# Patient Record
Sex: Female | Born: 2000 | Race: White | Hispanic: No | Marital: Single | Smoking: Never smoker
Health system: Southern US, Community
[De-identification: ages and names within clinical notes are randomized; demographics above are authoritative.]

## PROBLEM LIST (undated history)

## (undated) DIAGNOSIS — F419 Anxiety disorder, unspecified: Secondary | ICD-10-CM

## (undated) DIAGNOSIS — G43909 Migraine, unspecified, not intractable, without status migrainosus: Secondary | ICD-10-CM

## (undated) DIAGNOSIS — F32A Depression, unspecified: Secondary | ICD-10-CM

## (undated) DIAGNOSIS — F329 Major depressive disorder, single episode, unspecified: Secondary | ICD-10-CM

---
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
## 2021-05-26 IMAGING — CT CT RENAL STONE PROTOCOL
2 of 4 series · 16 of 46 positions shown, 18 images · non-contrast
Comparison: One view abdomen 05/22/2020. Pelvic ultrasound
05/23/2020.

CLINICAL DATA: Flank pain and hematuria. Suspected ureteral
calculus.

EXAM:
CT ABDOMEN AND PELVIS WITHOUT CONTRAST
TECHNIQUE: Multidetector CT imaging of the abdomen and pelvis was performed
following the standard protocol without IV contrast.

[Series 3: axial st · axial · 0.62mm/px · z∈[-476,-71]mm · 13 of 89 slices shown, 15 images]
[im 4/89  soft-tissue]
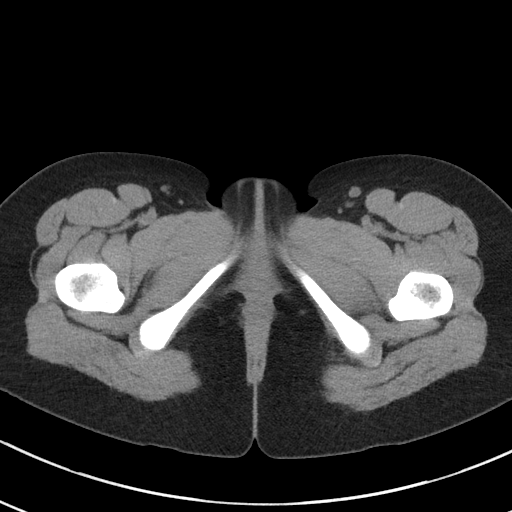
[im 4/89  bone]
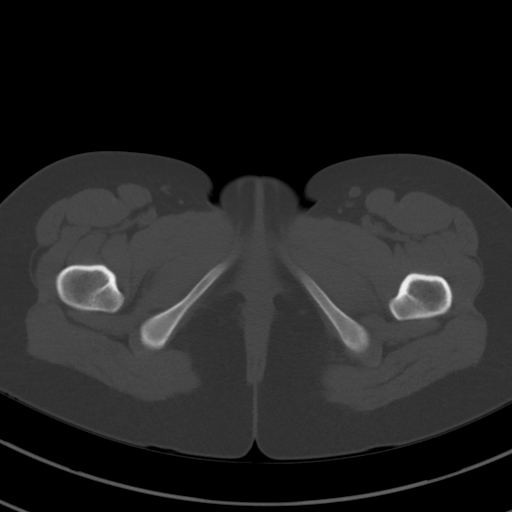
[im 11/89  soft-tissue]
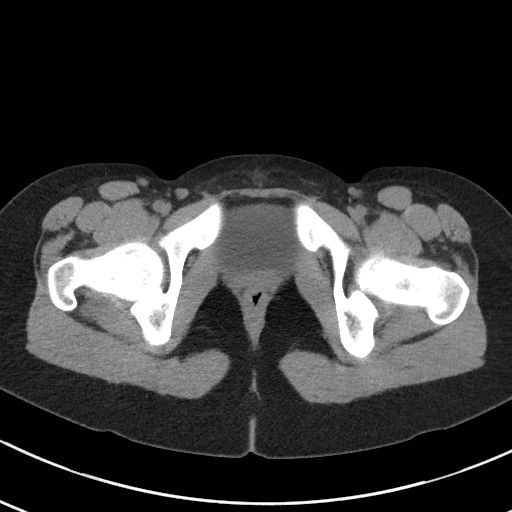
[im 17/89  soft-tissue]
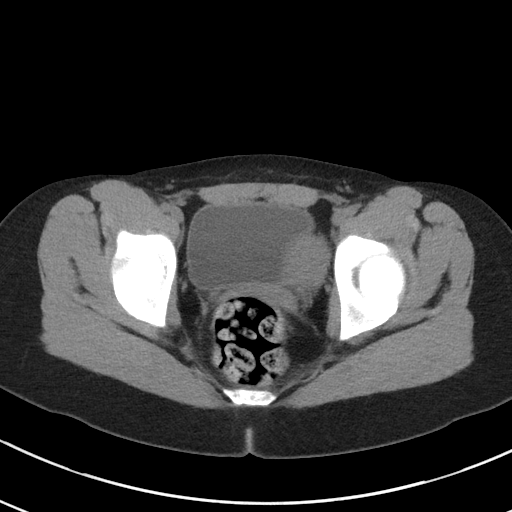
[im 24/89  soft-tissue]
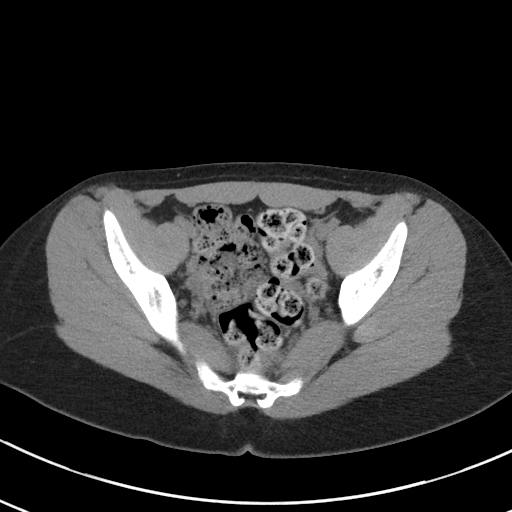
[im 31/89  soft-tissue]
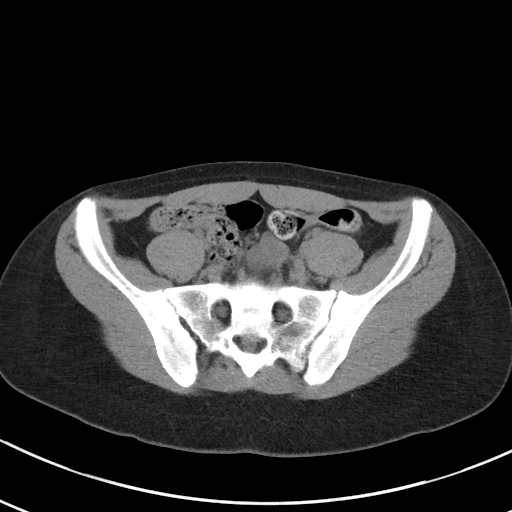
[im 38/89  soft-tissue]
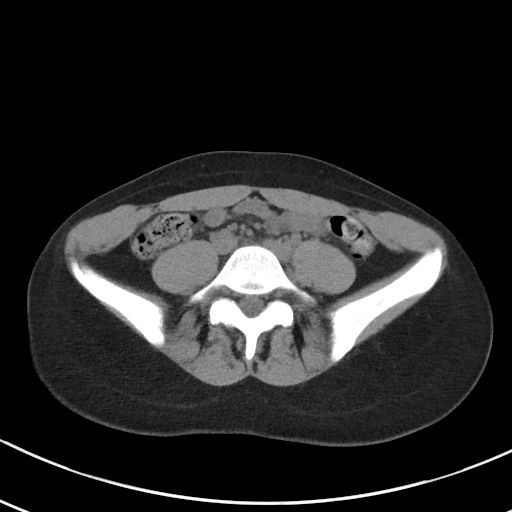
[im 45/89  soft-tissue]
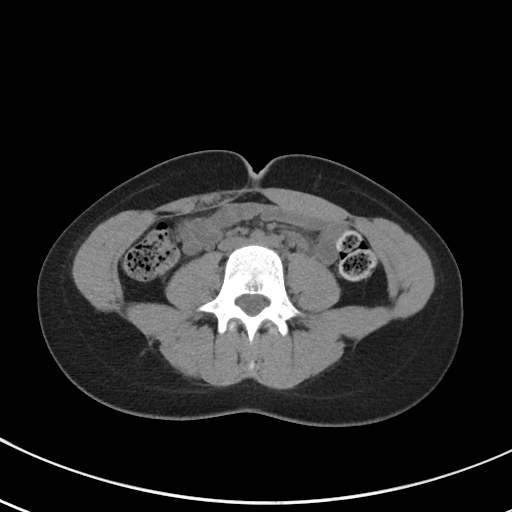
[im 51/89  soft-tissue]
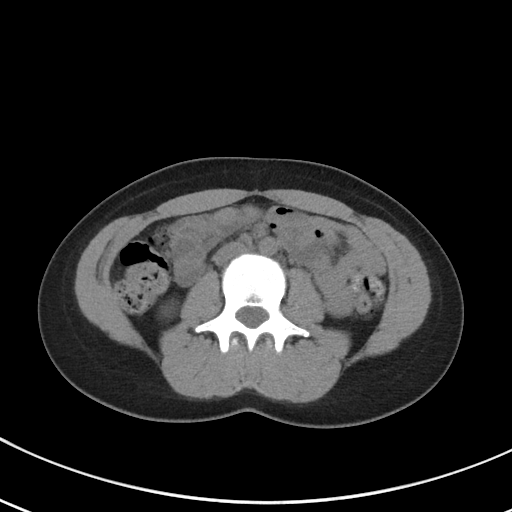
[im 58/89  soft-tissue]
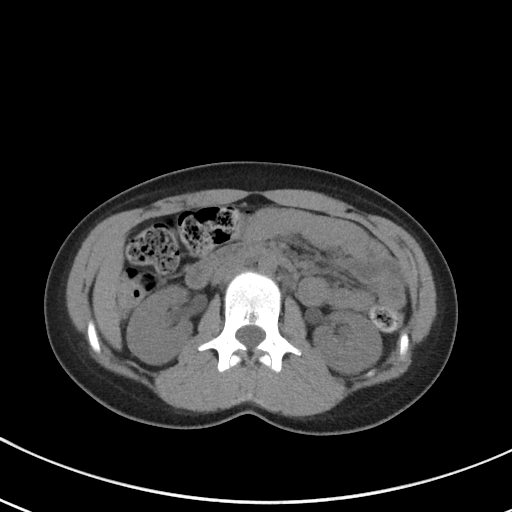
[im 58/89  bone]
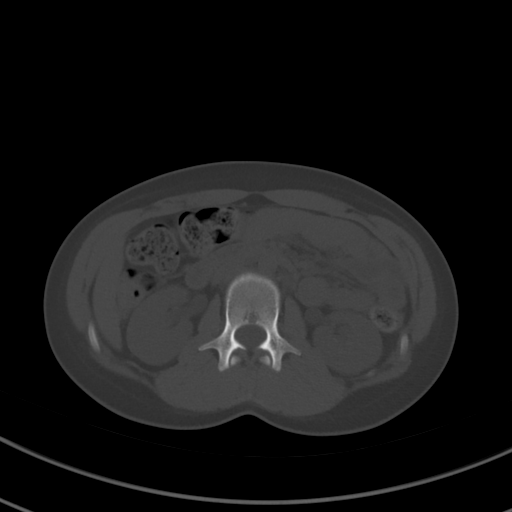
[im 65/89  soft-tissue]
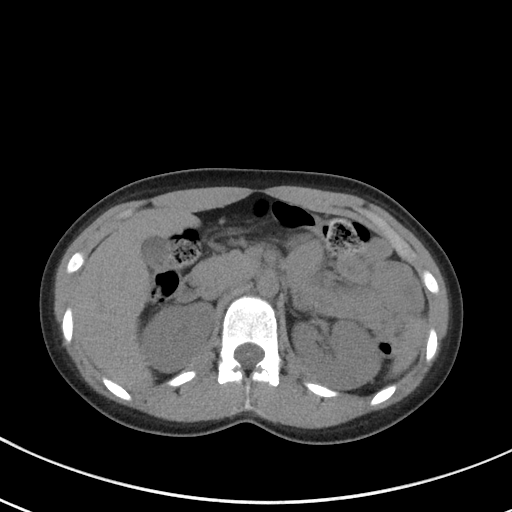
[im 72/89  soft-tissue]
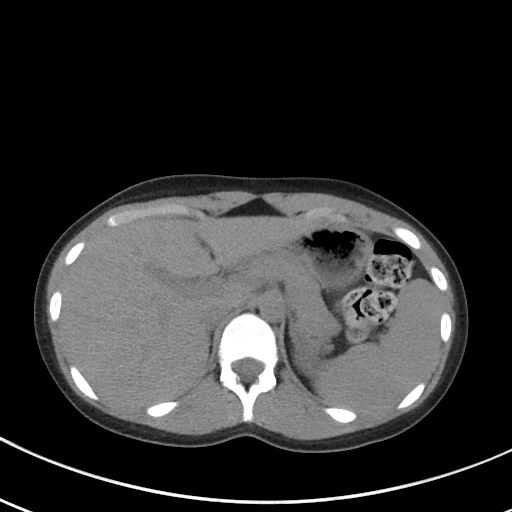
[im 78/89  soft-tissue]
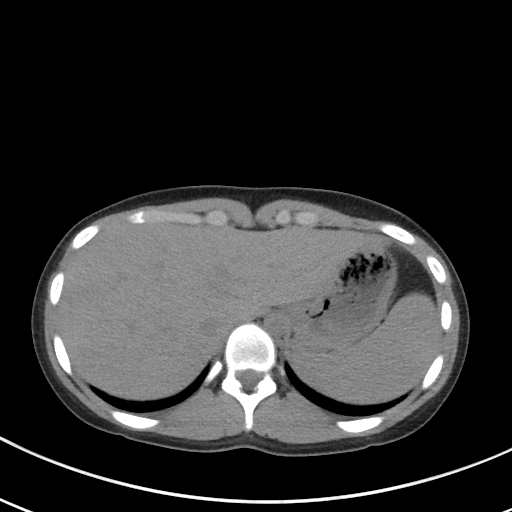
[im 85/89  soft-tissue]
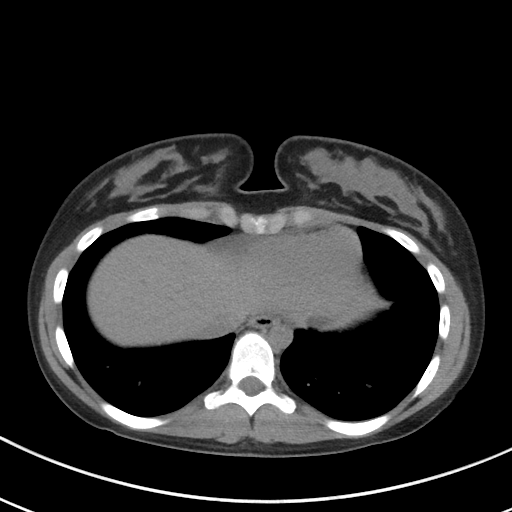

[Series 6: coronal st · coronal · 0.70mm/px · 3 of 61 slices shown]
[im 21/61  soft-tissue]
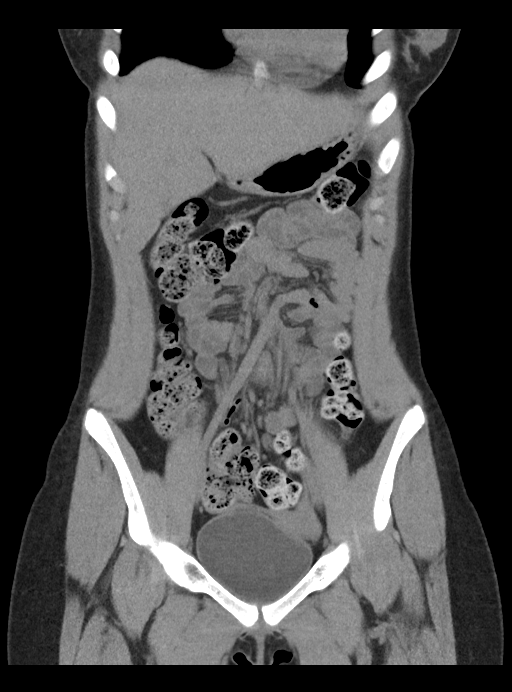
[im 27/61  soft-tissue]
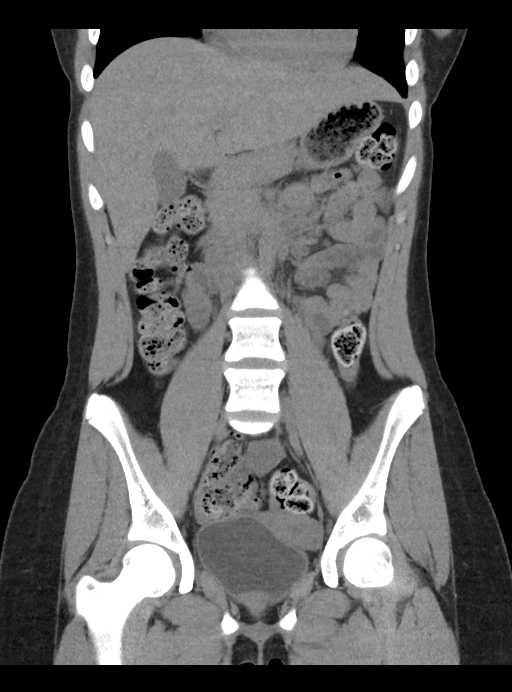
[im 34/61  soft-tissue]
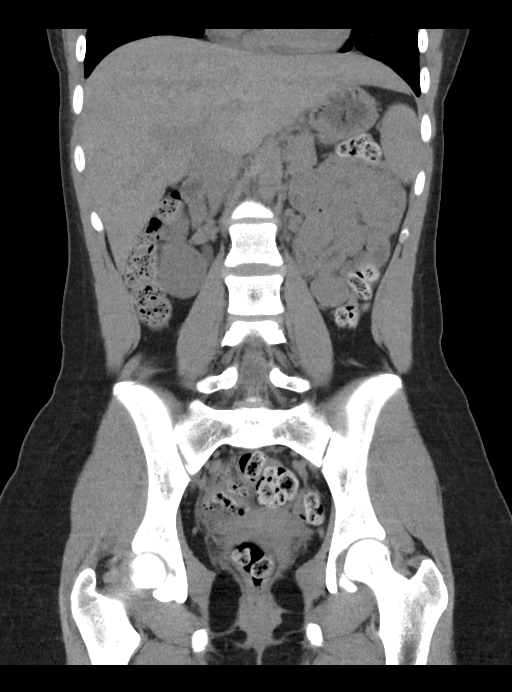

[16 of 46 positions shown; findings below may reference images not displayed]

FINDINGS: Lower chest: Clear lung bases. No significant pleural or pericardial
effusion.

Hepatobiliary: The liver appears unremarkable as imaged in the
noncontrast state. No evidence of gallstones, gallbladder wall
thickening or biliary dilatation.

Pancreas: Unremarkable. No pancreatic ductal dilatation or
surrounding inflammatory changes.

Spleen: Normal in size without focal abnormality.

Adrenals/Urinary Tract: Both adrenal glands appear normal. No
evidence of urinary tract calculus, hydronephrosis or perinephric
soft tissue stranding. The bladder appears normal.

Stomach/Bowel: No evidence of bowel wall thickening, distention or
surrounding inflammatory change. The appendix appears normal. There
is prominent stool throughout the colon.

Vascular/Lymphatic: There are no enlarged abdominal or pelvic lymph
nodes. No significant vascular findings on noncontrast imaging.

Reproductive: The uterus and ovaries appear normal. No adnexal mass.

Other: No evidence of abdominal wall mass or hernia. No ascites.

Musculoskeletal: No acute or significant osseous findings.
IMPRESSION: 1. No acute findings or explanation for the patient's symptoms. No
evidence of urinary tract calculus or hydronephrosis.
2. Prominent stool throughout the colon suggesting constipation.

## 2022-01-13 ENCOUNTER — Ambulatory Visit (INDEPENDENT_AMBULATORY_CARE_PROVIDER_SITE_OTHER): Payer: 59 | Admitting: Neurology

## 2022-01-13 ENCOUNTER — Other Ambulatory Visit: Payer: Self-pay

## 2022-01-13 ENCOUNTER — Encounter: Payer: Self-pay | Admitting: Neurology

## 2022-01-13 VITALS — BP 116/69 | HR 93 | Ht 62.0 in | Wt 130.0 lb

## 2022-01-13 DIAGNOSIS — G43109 Migraine with aura, not intractable, without status migrainosus: Secondary | ICD-10-CM | POA: Diagnosis not present

## 2022-01-13 DIAGNOSIS — G43711 Chronic migraine without aura, intractable, with status migrainosus: Secondary | ICD-10-CM | POA: Diagnosis not present

## 2022-01-13 DIAGNOSIS — R519 Headache, unspecified: Secondary | ICD-10-CM | POA: Diagnosis not present

## 2022-01-13 DIAGNOSIS — Q282 Arteriovenous malformation of cerebral vessels: Secondary | ICD-10-CM

## 2022-01-13 DIAGNOSIS — Q283 Other malformations of cerebral vessels: Secondary | ICD-10-CM

## 2022-01-13 DIAGNOSIS — R51 Headache with orthostatic component, not elsewhere classified: Secondary | ICD-10-CM

## 2022-01-13 DIAGNOSIS — G4484 Primary exertional headache: Secondary | ICD-10-CM

## 2022-01-13 MED ORDER — ONDANSETRON 4 MG PO TBDP: 4.0000 mg | ORAL_TABLET | Freq: Three times a day (TID) | ORAL | 3 refills | Status: DC | PRN

## 2022-01-13 MED ORDER — RIZATRIPTAN BENZOATE 10 MG PO TBDP: 10.0000 mg | ORAL_TABLET | ORAL | 11 refills | Status: DC | PRN

## 2022-01-13 NOTE — Progress Notes (Signed)
?[facility] ? ? ? ?Provider:  Dr Jaynee Eagles ?Requesting Provider: Orvan July, NP ?Primary Care Provider:  Hinton Lovely, NP ? ?CC:  Migraines and AVM ? ?HPI:  Charlotte Bryant is a 21 y.o. female here as requested by Orvan July, NP for migraine. PMHx migraines, anxiety, ocd(skin picking).  ? ?She has had headaches for most of her life, gotten worse since she went to college [employer]. She had one that was so bad she couldn't see it was so blurry, then half hour later she had the worst headache and then slept. In the temples, throbbing/pulsating, light and sound sensitivity, nausea and dizziness she is dizzy a lot. Very fatigued, can never get enough sleep, she naps a lot. About 10 migraine days a month and daily headaches for > 7 months. Migraines can last up to 24 hours, she sleeps them off and takes ibuprofen/advil. We discussed migraines in detail today, acute and preventative management. I gave her lots of literature to read on migraine associated vertigo, migraine aura, gave her an introductory migraine patient information. Spend extended visit educating patient. 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 Her headaches can be in the morning, they can be positional and exertional.Discussed imaging and possible migraine treatment. Possible migraine aura, discussed risk of stroke with patient. No other focal neurologic deficits, associated symptoms, inciting events or modifiable factors. Mother is here today and provides much information and discussed options and plans with mother as well. ? ? ?Reviewed notes, labs and imaging from outside physicians, which showed:  ? ?TSH 2.16 normal, cmp normal BUN 9 creat 0.75, CBC nml, Vit D(OH) 23 ? ?Thorough review of records, medications tried that can be used in migraine management includes: Zofran, Lexapro, Cymbalta, ibuprofen, amitriptyline, Bp med contraindicated due to hypotension,  topiramate contraindicated due to depression, low BMI, mood disorder (topiramate can cause worsening depression and weight loss) ? ?MRI and MRA::This study shows a small area of signal change in the anterior temporal lobe on the right suggesting a vascular pattern. However there is no enhancement. The possibility of AV malformation is to be considered and MR angiogram may be indicated ? ?Review of Systems: ?Patient complains of symptoms per HPI as well as the following symptoms mood disorder. Pertinent negatives and positives per HPI. All others negative. ? ? ?Social History  ? ?Socioeconomic History  ? Marital status: Single  ?  Spouse name: Not on file  ? Number of children: Not on file  ? Years of education: Not on file  ? Highest education level: Not on file  ?Occupational History  ? Not on file  ?Tobacco Use  ? Smoking status: Never  ? Smokeless tobacco: Never  ?Vaping Use  ? Vaping Use: Never used  ?Substance and Sexual Activity  ? Alcohol use: No  ? Drug use: No  ? Sexual activity: Not on file  ?Other Topics Concern  ? Not on file  ?Social History Narrative  ? Lives in a dorm at [employer] [employer]. She is a freshman  ? Right handed   ? Caffeine: 1 cup coffee/day  ? ?Social Determinants of Health  ? ?Financial Resource Strain: Not on file  ?Food Insecurity: Not on file  ?Transportation Needs: Not on file  ?Physical Activity: Not on file  ?Stress: Not on file  ?Social Connections: Not on file  ?Intimate Partner Violence: Not on file  ? ? ?Family History  ?Problem Relation  Age of Onset  ? Migraines Mother   ? High blood pressure Other   ? High Cholesterol Other   ? Anxiety disorder Other   ? ? ?Past Medical History:  ?Diagnosis Date  ? Anxiety   ? Depression   ? Migraine   ? ? ?Patient Active Problem List  ? Diagnosis Date Noted  ? Migraines 01/16/2022  ? Migraine with aura and without status migrainosus, not intractable 01/16/2022  ? ? ?History reviewed. No pertinent surgical history. ? ?Current Outpatient Medications   ?Medication Sig Dispense Refill  ? NIKKI 3-0.02 MG tablet Take 1 tablet by mouth daily.    ? ondansetron (ZOFRAN-ODT) 4 MG disintegrating tablet Take 1-2 tablets (4-8 mg total) by mouth every 8 (eight) hours as needed. 30 tablet 3  ? rizatriptan (MAXALT-MLT) 10 MG disintegrating tablet Take 1 tablet (10 mg total) by mouth as needed for migraine. May repeat in 2 hours if needed 9 tablet 11  ? ?No current facility-administered medications for this visit.  ? ? ?Allergies as of 01/13/2022 - Review Complete 01/13/2022  ?Allergen Reaction Noted  ? Fructose Other (See Comments) 09/01/2011  ? Penicillins  05/09/2017  ? ? ?Vitals: ?BP 116/69 (BP Location: Left Arm, Patient Position: Sitting)   Pulse 93   Ht '5\' 2"'$  (1.575 m)   Wt 130 lb (59 kg)   LMP 01/12/2022   BMI 23.78 kg/m?  ?Last Weight:  ?Wt Readings from Last 1 Encounters:  ?01/13/22 130 lb (59 kg) (54 %, Z= 0.11)*  ? ?* Growth percentiles are based on CDC (Girls, 2-20 Years) data.  ? ?Last Height:   ?Ht Readings from Last 1 Encounters:  ?01/13/22 '5\' 2"'$  (1.575 m) (18 %, Z= -0.90)*  ? ?* Growth percentiles are based on CDC (Girls, 2-20 Years) data.  ? ? ? ?Physical exam: ?Exam: ?Gen: NAD, conversant, well nourised, well groomed                     ?CV: RRR, no MRG. No Carotid Bruits. No peripheral edema, warm, nontender ?Eyes: Conjunctivae clear without exudates or hemorrhage ? ?Neuro: ?Detailed Neurologic Exam ? ?Speech: ?   Speech is normal; fluent and spontaneous with normal comprehension.  ?Cognition: ?   The patient is oriented to person, place, and time;  ?   recent and remote memory intact;  ?   language fluent;  ?   normal attention, concentration,  ?   fund of knowledge ?Cranial Nerves: ?   The pupils are equal, round, and reactive to light. The fundi are normal and spontaneous venous pulsations are present. Visual fields are full to finger confrontation. Extraocular movements are intact. Trigeminal sensation is intact and the muscles of mastication are  normal. The face is symmetric. The palate elevates in the midline. Hearing intact. Voice is normal. Shoulder shrug is normal. The tongue has normal motion without fasciculations.  ? ?Coordination: ?   Normal finger to nose and heel to shin. Normal rapid alternating movements.  ? ?Gait: ?   Heel-toe and tandem gait are normal.  ? ?Motor Observation: ?   No asymmetry, no atrophy, and no involuntary movements noted. ?Tone: ?   Normal muscle tone.   ? ?Posture: ?   Posture is normal. normal erect ?   ?Strength: ?   Strength is V/V in the upper and lower limbs.  ?    ?Sensation: intact to LT ?    ?Reflex Exam: ? ?DTR's: ?   Deep tendon reflexes  in the upper and lower extremities are normal bilaterally.   ?Toes: ?   The toes are downgoing bilaterally.   ?Clonus: ?   Clonus is absent. ?  ? ?Assessment/Plan:  Patient with chronic migraines. Decided on Ajovy today and provided injection training. Spent extended visit educating patient on migraines. She has an AVM, has been seen on prior MRIs, MRA in the past recommended but not completed. Discussed imaging and possible migraine treatment. Given worsening headaches and possible AVM need imaging.  ? ?Preventative: Monthly Ajovy ?Acute/emergent: Rizatriptan(Maxalt): Please take one tablet at the onset of your headache. If it does not improve the symptoms please take one additional tablet. Do not take more then 2 tablets in 24hrs. Do not take use more then 2 to 3 times in a week.  ?Nausea: Ondansetron ?MRI and MRA of the brain: MRI brain due to concerning symptoms of morning headaches, positional and exertional headaches, AVM(needs follow up imaging), worsening headaches. ? ? ?No orders of the defined types were placed in this encounter. ? ?Meds ordered this encounter  ?Medications  ? rizatriptan (MAXALT-MLT) 10 MG disintegrating tablet  ?  Sig: Take 1 tablet (10 mg total) by mouth as needed for migraine. May repeat in 2 hours if needed  ?  Dispense:  9 tablet  ?  Refill:  11  ?  ondansetron (ZOFRAN-ODT) 4 MG disintegrating tablet  ?  Sig: Take 1-2 tablets (4-8 mg total) by mouth every 8 (eight) hours as needed.  ?  Dispense:  30 tablet  ?  Refill:  3  ? ? ?Cc: Orvan July, NP,  Tonette Bihari, [employer]

## 2022-01-13 NOTE — Patient Instructions (Signed)
Preventative: Monthly [place] ?Acute/emergent: Rizatriptan(Maxalt): Please take one tablet at the onset of your headache. If it does not improve the symptoms please take one additional tablet. Do not take more then 2 tablets in 24hrs. Do not take use more then 2 to 3 times in a week.  ?Nausea: Ondansetron ?MRI and MRA of the brain ? ?Ondansetron Dissolving Tablets ?What is this medication? ?ONDANSETRON (on DAN se tron) prevents nausea and vomiting from chemotherapy, radiation, or surgery. It works by blocking substances in the body that may cause nausea or vomiting. It belongs to a group of medications called antiemetics. ?This medicine may be used for other purposes; ask your health care provider or pharmacist if you have questions. ?COMMON BRAND NAME(S): Zofran ODT ?What should I tell my care team before I take this medication? ?They need to know if you have any of these conditions: ?Heart disease ?History of irregular heartbeat ?Liver disease ?Low levels of magnesium or potassium in the blood ?An unusual or allergic reaction to ondansetron, granisetron, other medications, foods, dyes, or preservatives ?Pregnant or trying to get pregnant ?Breast-feeding ?How should I use this medication? ?These tablets are made to dissolve in the mouth. Do not try to push the tablet through the foil backing. With dry hands, peel away the foil backing and gently remove the tablet. Place the tablet in the mouth and allow it to dissolve, then swallow. While you may take these tablets with water, it is not necessary to do so. ?Talk to your care team regarding the use of this medication in children. Special care may be needed. ?Overdosage: If you think you have taken too much of this medicine contact a poison control center or emergency room at once. ?NOTE: This medicine is only for you. Do not share this medicine with others. ?What if I miss a dose? ?If you miss a dose, take it as soon as you can. If it is almost time for your next  dose, take only that dose. Do not take double or extra doses. ?What may interact with this medication? ?Do not take this medication with any of the following: ?Apomorphine ?Certain medications for fungal infections like fluconazole, itraconazole, ketoconazole, posaconazole, voriconazole ?Cisapride ?Dronedarone ?Pimozide ?Thioridazine ?This medication may also interact with the following: ?Carbamazepine ?Certain medications for depression, anxiety, or psychotic disturbances ?Fentanyl ?Linezolid ?MAOIs like Carbex, Eldepryl, Marplan, Nardil, and Parnate ?Methylene blue (injected into a vein) ?Other medications that prolong the QT interval (cause an abnormal heart rhythm) like dofetilide, ziprasidone ?Phenytoin ?Rifampicin ?Tramadol ?This list may not describe all possible interactions. Give your health care provider a list of all the medicines, herbs, non-prescription drugs, or dietary supplements you use. Also tell them if you smoke, drink alcohol, or use illegal drugs. Some items may interact with your medicine. ?What should I watch for while using this medication? ?Check with your care team as soon as you can if you have any sign of an allergic reaction. ?What side effects may I notice from receiving this medication? ?Side effects that you should report to your care team as soon as possible: ?Allergic reactions--skin rash, itching, hives, swelling of the face, lips, tongue, or throat ?Bowel blockage--stomach cramping, unable to have a bowel movement or pass gas, loss of appetite, vomiting ?Chest pain (angina)--pain, pressure, or tightness in the chest, neck, back, or arms ?Heart rhythm changes--fast or irregular heartbeat, dizziness, feeling faint or lightheaded, chest pain, trouble breathing ?Irritability, confusion, fast or irregular heartbeat, muscle stiffness, twitching muscles, sweating, high fever, seizure,  chills, vomiting, diarrhea, which may be signs of serotonin syndrome ?Side effects that usually do not  require medical attention (report to your care team if they continue or are bothersome): ?Constipation ?Diarrhea ?General discomfort and fatigue ?Headache ?This list may not describe all possible side effects. Call your doctor for medical advice about side effects. You may report side effects to FDA at 1-800-FDA-1088. ?Where should I keep my medication? ?Keep out of the reach of children and pets. ?Store between 2 and 30 degrees C (36 and 86 degrees F). Throw away any unused medication after the expiration date. ?NOTE: This sheet is a summary. It may not cover all possible information. If you have questions about this medicine, talk to your doctor, pharmacist, or health care provider. ?? 2022 [employer]/Gold Standard (2020-11-20 00:00:00) ?Rizatriptan Tablets ?What is this medication? ?RIZATRIPTAN (rye za TRIP tan) treats migraines. It works by blocking pain signals and narrowing blood vessels in the brain. It belongs to a group of medications called triptans. It is not used to prevent migraines. ?This medicine may be used for other purposes; ask your health care provider or pharmacist if you have questions. ?COMMON BRAND NAME(S): Maxalt ?What should I tell my care team before I take this medication? ?They need to know if you have any of these conditions: ?Cigarette smoker ?Circulation problems in fingers and toes ?Diabetes ?Heart disease ?High blood pressure ?High cholesterol ?History of irregular heartbeat ?History of stroke ?Kidney disease ?Liver disease ?Stomach or intestine problems ?An unusual or allergic reaction to rizatriptan, other medications, foods, dyes, or preservatives ?Pregnant or trying to get pregnant ?Breast-feeding ?How should I use this medication? ?Take this medication by mouth with a glass of water. Follow the directions on the prescription label. Do not take it more often than directed. ?Talk to your care team regarding the use of this medication in children. While this medication may be  prescribed for children as young as 6 years for selected conditions, precautions do apply. ?Overdosage: If you think you have taken too much of this medicine contact a poison control center or emergency room at once. ?NOTE: This medicine is only for you. Do not share this medicine with others. ?What if I miss a dose? ?This does not apply. This medication is not for regular use. ?What may interact with this medication? ?Do not take this medication with any of the following: ?Certain medications for migraine headache like almotriptan, eletriptan, frovatriptan, naratriptan, rizatriptan, sumatriptan, zolmitriptan ?Ergot alkaloids like dihydroergotamine, ergonovine, ergotamine, methylergonovine ?MAOIs like Carbex, Eldepryl, Marplan, Nardil, and Parnate ?This medication may also interact with the following: ?Certain medications for depression, anxiety, or psychotic disorders ?Propranolol ?This list may not describe all possible interactions. Give your health care provider a list of all the medicines, herbs, non-prescription drugs, or dietary supplements you use. Also tell them if you smoke, drink alcohol, or use illegal drugs. Some items may interact with your medicine. ?What should I watch for while using this medication? ?Visit your care team for regular checks on your progress. Tell your care team if your symptoms do not start to get better or if they get worse. ?You may get drowsy or dizzy. Do not drive, use machinery, or do anything that needs mental alertness until you know how this medication affects you. Do not stand up or sit up quickly, especially if you are an older patient. This reduces the risk of dizzy or fainting spells. Alcohol may interfere with the effect of this medication. ?Your mouth may get dry.  Chewing sugarless gum or sucking hard candy and drinking plenty of water may help. Contact your care team if the problem does not go away or is severe. ?If you take migraine medications for 10 or more days a  month, your migraines may get worse. Keep a diary of headache days and medication use. Contact your care team if your migraine attacks occur more frequently. ?What side effects may I notice from receiving this

## 2022-01-16 DIAGNOSIS — G43109 Migraine with aura, not intractable, without status migrainosus: Secondary | ICD-10-CM | POA: Insufficient documentation

## 2022-01-16 DIAGNOSIS — G43909 Migraine, unspecified, not intractable, without status migrainosus: Secondary | ICD-10-CM | POA: Insufficient documentation

## 2022-01-16 MED ORDER — AJOVY 225 MG/1.5ML ~~LOC~~ SOAJ: 225.0000 mg | SUBCUTANEOUS | 11 refills | Status: DC

## 2022-01-18 ENCOUNTER — Telehealth: Payer: Self-pay | Admitting: Neurology

## 2022-01-18 NOTE — Telephone Encounter (Signed)
LVM for pt to call back to schedule  ?[place]: [id number] (exp. 01/17/22 to 03/03/22)  ?

## 2022-01-19 NOTE — Telephone Encounter (Signed)
Patient mother Bryson Ha called me and asked for the MRI to be scheduled on a Saturday due to her being in school... I informed her I will send the order to GI, they will reach out to the patient to schedule. ?

## 2022-02-04 ENCOUNTER — Ambulatory Visit
Admission: RE | Admit: 2022-02-04 | Discharge: 2022-02-04 | Disposition: A | Discharge status: Home / Self Care | Payer: 59 | Source: Ambulatory Visit | Attending: Neurology | Admitting: Neurology

## 2022-02-04 DIAGNOSIS — Q282 Arteriovenous malformation of cerebral vessels: Secondary | ICD-10-CM

## 2022-02-04 DIAGNOSIS — Q283 Other malformations of cerebral vessels: Secondary | ICD-10-CM

## 2022-02-04 DIAGNOSIS — R519 Headache, unspecified: Secondary | ICD-10-CM | POA: Diagnosis not present

## 2022-02-04 DIAGNOSIS — R51 Headache with orthostatic component, not elsewhere classified: Secondary | ICD-10-CM

## 2022-02-04 DIAGNOSIS — G4484 Primary exertional headache: Secondary | ICD-10-CM

## 2022-02-04 MED ORDER — GADOBENATE DIMEGLUMINE 529 MG/ML IV SOLN
12.0000 mL | Freq: Once | INTRAVENOUS | Status: AC | PRN
  Administered 2022-02-04: 12 mL via INTRAVENOUS

## 2022-02-15 ENCOUNTER — Encounter: Payer: Self-pay | Admitting: Neurology

## 2022-03-10 ENCOUNTER — Telehealth (INDEPENDENT_AMBULATORY_CARE_PROVIDER_SITE_OTHER): Payer: 59 | Admitting: Neurology

## 2022-03-10 DIAGNOSIS — G43711 Chronic migraine without aura, intractable, with status migrainosus: Secondary | ICD-10-CM | POA: Diagnosis not present

## 2022-03-10 DIAGNOSIS — G43709 Chronic migraine without aura, not intractable, without status migrainosus: Secondary | ICD-10-CM

## 2022-03-10 MED ORDER — EMGALITY 120 MG/ML ~~LOC~~ SOAJ: 120.0000 mg | SUBCUTANEOUS | 11 refills | Status: DC

## 2022-03-10 NOTE — Patient Instructions (Addendum)
Preventative: Monthly change to [employer] ([email or web address] ?Acute/emergent: Try Rizatriptan(Maxalt): Please take one tablet at the onset of your headache. If it does not improve the symptoms please take one additional tablet. Do not take more then 2 tablets in 24hrs. Do not take use more then 2 to 3 times in a week.  ?Nausea: Ondansetron ? ? ?Ondansetron Dissolving Tablets ?What is this medication? ?ONDANSETRON (on DAN se tron) prevents nausea and vomiting from chemotherapy, radiation, or surgery. It works by blocking substances in the body that may cause nausea or vomiting. It belongs to a group of medications called antiemetics. ?This medicine may be used for other purposes; ask your health care provider or pharmacist if you have questions. ?COMMON BRAND NAME(S): Zofran ODT ?What should I tell my care team before I take this medication? ?They need to know if you have any of these conditions: ?Heart disease ?History of irregular heartbeat ?Liver disease ?Low levels of magnesium or potassium in the blood ?An unusual or allergic reaction to ondansetron, granisetron, other medications, foods, dyes, or preservatives ?Pregnant or trying to get pregnant ?Breast-feeding ?How should I use this medication? ?These tablets are made to dissolve in the mouth. Do not try to push the tablet through the foil backing. With dry hands, peel away the foil backing and gently remove the tablet. Place the tablet in the mouth and allow it to dissolve, then swallow. While you may take these tablets with water, it is not necessary to do so. ?Talk to your care team regarding the use of this medication in children. Special care may be needed. ?Overdosage: If you think you have taken too much of this medicine contact a poison control center or emergency room at once. ?NOTE: This medicine is only for you. Do not share this medicine with others. ?What if I miss a dose? ?If you miss a dose, take it as soon as you can. If it is  almost time for your next dose, take only that dose. Do not take double or extra doses. ?What may interact with this medication? ?Do not take this medication with any of the following: ?Apomorphine ?Certain medications for fungal infections like fluconazole, itraconazole, ketoconazole, posaconazole, voriconazole ?Cisapride ?Dronedarone ?Pimozide ?Thioridazine ?This medication may also interact with the following: ?Carbamazepine ?Certain medications for depression, anxiety, or psychotic disturbances ?Fentanyl ?Linezolid ?MAOIs like Carbex, Eldepryl, Marplan, Nardil, and Parnate ?Methylene blue (injected into a vein) ?Other medications that prolong the QT interval (cause an abnormal heart rhythm) like dofetilide, ziprasidone ?Phenytoin ?Rifampicin ?Tramadol ?This list may not describe all possible interactions. Give your health care provider a list of all the medicines, herbs, non-prescription drugs, or dietary supplements you use. Also tell them if you smoke, drink alcohol, or use illegal drugs. Some items may interact with your medicine. ?What should I watch for while using this medication? ?Check with your care team as soon as you can if you have any sign of an allergic reaction. ?What side effects may I notice from receiving this medication? ?Side effects that you should report to your care team as soon as possible: ?Allergic reactions--skin rash, itching, hives, swelling of the face, lips, tongue, or throat ?Bowel blockage--stomach cramping, unable to have a bowel movement or pass gas, loss of appetite, vomiting ?Chest pain (angina)--pain, pressure, or tightness in the chest, neck, back, or arms ?Heart rhythm changes--fast or irregular heartbeat, dizziness, feeling faint or lightheaded, chest pain, trouble breathing ?Irritability, confusion, fast or irregular heartbeat, muscle stiffness, twitching muscles, sweating, high fever, seizure, chills,  vomiting, diarrhea, which may be signs of serotonin syndrome ?Side  effects that usually do not require medical attention (report to your care team if they continue or are bothersome): ?Constipation ?Diarrhea ?General discomfort and fatigue ?Headache ?This list may not describe all possible side effects. Call your doctor for medical advice about side effects. You may report side effects to FDA at 1-800-FDA-1088. ?Where should I keep my medication? ?Keep out of the reach of children and pets. ?Store between 2 and 30 degrees C (36 and 86 degrees F). Throw away any unused medication after the expiration date. ?NOTE: This sheet is a summary. It may not cover all possible information. If you have questions about this medicine, talk to your doctor, pharmacist, or health care provider. ?? 2022 [employer]/Gold Standard (2020-11-20 00:00:00) ?Rizatriptan Tablets ?What is this medication? ?RIZATRIPTAN (rye za TRIP tan) treats migraines. It works by blocking pain signals and narrowing blood vessels in the brain. It belongs to a group of medications called triptans. It is not used to prevent migraines. ?This medicine may be used for other purposes; ask your health care provider or pharmacist if you have questions. ?COMMON BRAND NAME(S): Maxalt ?What should I tell my care team before I take this medication? ?They need to know if you have any of these conditions: ?Cigarette smoker ?Circulation problems in fingers and toes ?Diabetes ?Heart disease ?High blood pressure ?High cholesterol ?History of irregular heartbeat ?History of stroke ?Kidney disease ?Liver disease ?Stomach or intestine problems ?An unusual or allergic reaction to rizatriptan, other medications, foods, dyes, or preservatives ?Pregnant or trying to get pregnant ?Breast-feeding ?How should I use this medication? ?Take this medication by mouth with a glass of water. Follow the directions on the prescription label. Do not take it more often than directed. ?Talk to your care team regarding the use of this medication in children. While  this medication may be prescribed for children as young as 6 years for selected conditions, precautions do apply. ?Overdosage: If you think you have taken too much of this medicine contact a poison control center or emergency room at once. ?NOTE: This medicine is only for you. Do not share this medicine with others. ?What if I miss a dose? ?This does not apply. This medication is not for regular use. ?What may interact with this medication? ?Do not take this medication with any of the following: ?Certain medications for migraine headache like almotriptan, eletriptan, frovatriptan, naratriptan, rizatriptan, sumatriptan, zolmitriptan ?Ergot alkaloids like dihydroergotamine, ergonovine, ergotamine, methylergonovine ?MAOIs like Carbex, Eldepryl, Marplan, Nardil, and Parnate ?This medication may also interact with the following: ?Certain medications for depression, anxiety, or psychotic disorders ?Propranolol ?This list may not describe all possible interactions. Give your health care provider a list of all the medicines, herbs, non-prescription drugs, or dietary supplements you use. Also tell them if you smoke, drink alcohol, or use illegal drugs. Some items may interact with your medicine. ?What should I watch for while using this medication? ?Visit your care team for regular checks on your progress. Tell your care team if your symptoms do not start to get better or if they get worse. ?You may get drowsy or dizzy. Do not drive, use machinery, or do anything that needs mental alertness until you know how this medication affects you. Do not stand up or sit up quickly, especially if you are an older patient. This reduces the risk of dizzy or fainting spells. Alcohol may interfere with the effect of this medication. ?Your mouth may get dry. Chewing  sugarless gum or sucking hard candy and drinking plenty of water may help. Contact your care team if the problem does not go away or is severe. ?If you take migraine medications  for 10 or more days a month, your migraines may get worse. Keep a diary of headache days and medication use. Contact your care team if your migraine attacks occur more frequently. ?What side effects may I

## 2022-03-10 NOTE — Progress Notes (Signed)
?[facility] ? ? ? ?Provider:  Dr Jaynee Eagles ?Requesting Provider: Hinton Lovely, NP ?Primary Care Provider:  Hinton Lovely, NP ? ?CC:  Migraines and AVM ? ?Virtual Visit via Video Note ? ?I connected with Charlotte Bryant on 03/10/22 at 10:45 AM EDT by a video enabled telemedicine application and verified that I am speaking with the correct person using two identifiers. ? ?Location: ?Patient: home ?Provider: office ?  ?I discussed the limitations of evaluation and management by telemedicine and the availability of in person appointments. The patient expressed understanding and agreed to proceed. ? ?Follow Up Instructions: ? ?  ?I discussed the assessment and treatment plan with the patient. The patient was provided an opportunity to ask questions and all were answered. The patient agreed with the plan and demonstrated an understanding of the instructions. ?  ?The patient was advised to call back or seek an in-person evaluation if the symptoms worsen or if the condition fails to improve as anticipated. ? ?I provided 30 minutes of non-face-to-face time during this encounter. ? ? ?Melvenia Beam, MD ? ? ?03/10/2022: Will try Emgality. Aimovig contraindicated due to constipation. Ajovy with a rash and flu-like symptoms 8 migraine days a month and > 15 headache days, has not tried rizatriptan. ? ? ?Reviewed imaging (20 minutes)  ? ?ORDERING CLINICIAN: Sarina Ill, MD ?CLINICAL HISTORY: 21 year old woman with headache and vascular malformation on past MRI ?COMPARISON FILMS: MRI 05/05/2014, 05/07/2014 and 08/24/2017 ?  ?TECHNIQUE:MRI of the brain with and without contrast was obtained utilizing 5 mm axial slices with T1, T2, T2 flair, SWI and diffusion weighted views.  T1 sagittal, T2 coronal and postcontrast views in the axial and coronal plane were obtained.  CONTRAST: 12 ml Multihance ?IMAGING SITE: [facility], [place], [place]  ?  ?01/2022: FINDINGS: personally reviewed  images and agree:  On sagittal images, the spinal cord is imaged caudally to C4 and is normal in caliber.   The contents of the posterior fossa are of normal size and position.   The pituitary gland and optic chiasm appear normal.    Brain volume appears normal.   The ventricles are normal in size and without distortion.  There are no abnormal extra-axial collections of fluid.   ?  ?As noted previously, there is a 3 to 4 mm focus in the right frontal lobe that is hypointense on T1-weighted images, hyperintense on T2 and FLAIR images, shows increased susceptibility on SWI images and mildly enhances homogenously after contrast administration.  It is best seen on the SWI image #21 and axial postcontrast image #80.  It is unchanged in appearance compared to the previous MRIs.  the cerebellum and brainstem appears normal.   The deep gray matter appears normal.  Diffusion weighted images are normal.   ?  ?The orbits appear normal.   The VIIth/VIIIth nerve complex appears normal.  The mastoid air cells appear normal.  The paranasal sinuses appear normal.  Flow voids are identified within the major intracerebral arteries.   ?  ?After the infusion of contrast material, a normal enhancement pattern is noted. ?  ?  ?IMPRESSION: This MRI of the brain with and without contrast shows the following: ?1.   Small (3 to 4 mm) focus in the right frontal lobe with increased susceptibility on SWI images and mild homogenous enhancement.  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  It is stable compared to the 2015 and 2018 MRIs. ?2.  The study was otherwise normal.  No acute findings. ? ?FINDINGS: The imaged extracranial and intracranial portions of the internal carotid arteries appear normal. The middle cerebral and anterior cerebral arteries appear normal. In the posterior circulation, the vertebral arteries are co-dominant. No stenosis is noted within the vertebral arteries and the  basilar arteries. The posterior cerebral arteries appear normal. No aneurysms were identified.  Of note, the region of abnormality on the MRI of the brain with and without contrast in the right frontal lobe is normal on the MR angiogram ?  ? Patient complains of symptoms per HPI as well as the following symptoms: stress(finals at school) . Pertinent negatives and positives per HPI. All others negative ? ?  ?IMPRESSION: This is a normal MR angiogram of the intracranial arteries.  Of note, the region of abnormality noted on the MRI of the brain in the right frontal lobe is normal on the study. ? ?HPI:  Charlotte Bryant is a 21 y.o. female here as requested by Hinton Lovely, NP for migraine. PMHx migraines, anxiety, ocd(skin picking).  ? ?She has had headaches for most of her life, gotten worse since she went to college [employer]. She had one that was so bad she couldn't see it was so blurry, then half hour later she had the worst headache and then slept. In the temples, throbbing/pulsating, light and sound sensitivity, nausea and dizziness she is dizzy a lot. Very fatigued, can never get enough sleep, she naps a lot. About 10 migraine days a month and daily headaches for > 7 months. Migraines can last up to 24 hours, she sleeps them off and takes ibuprofen/advil. We discussed migraines in detail today, acute and preventative management. I gave her lots of literature to read on migraine associated vertigo, migraine aura, gave her an introductory migraine patient information. Spend extended visit educating patient. 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 Her headaches can be in the morning, they can be positional and exertional.Discussed imaging and possible migraine treatment. Possible migraine aura, discussed risk of stroke with patient. No other focal neurologic deficits, associated symptoms, inciting events or modifiable factors. Mother is here today and  provides much information and discussed options and plans with mother as well. ? ? ?Reviewed notes, labs and imaging from outside physicians, which showed:  ? ?TSH 2.16 normal, cmp normal BUN 9 creat 0.75, CBC nml, Vit D(OH) 23 ? ?Thorough review of records, medications tried that can be used in migraine management includes: Zofran, Lexapro, Cymbalta, ibuprofen, amitriptyline, Bp(propranolol) med contraindicated due to hypotension, topiramate contraindicated due to depression, low BMI, mood disorder (topiramate can cause worsening depression and weight loss) ? ?MRI and MRA::This study shows a small area of signal change in the anterior temporal lobe on the right suggesting a vascular pattern. However there is no enhancement. The possibility of AV malformation is to be considered and MR angiogram may be indicated ? ?Review of Systems: ?Patient complains of symptoms per HPI as well as the following symptoms mood disorder. Pertinent negatives and positives per HPI. All others negative. ? ? ?Social History  ? ?Socioeconomic History  ? Marital status: Single  ?  Spouse name: Not on file  ? Number of children: Not on file  ? Years of education: Not on file  ? Highest education level: Not on file  ?Occupational History  ? Not on file  ?Tobacco Use  ? Smoking status: Never  ?  Smokeless tobacco: Never  ?Vaping Use  ? Vaping Use: Never used  ?Substance and Sexual Activity  ? Alcohol use: No  ? Drug use: No  ? Sexual activity: Not on file  ?Other Topics Concern  ? Not on file  ?Social History Narrative  ? Lives in a dorm at [facility]. She is a freshman  ? Right handed   ? Caffeine: 1 cup coffee/day  ? ?Social Determinants of Health  ? ?Financial Resource Strain: Not on file  ?Food Insecurity: Not on file  ?Transportation Needs: Not on file  ?Physical Activity: Not on file  ?Stress: Not on file  ?Social Connections: Not on file  ?Intimate Partner Violence: Not on file  ? ? ?Family History  ?Problem Relation Age of Onset  ? Migraines  Mother   ? High blood pressure Other   ? High Cholesterol Other   ? Anxiety disorder Other   ? ? ?Past Medical History:  ?Diagnosis Date  ? Anxiety   ? Depression   ? Migraine   ? ? ?Patient Active Problem Nicoletta Dress

## 2022-03-14 ENCOUNTER — Telehealth: Payer: Self-pay | Admitting: *Deleted

## 2022-03-14 NOTE — Telephone Encounter (Signed)
Charlotte Bryant (Key: [id number]) ?Rx #: [id number] ?Emgality '120MG'$ /ML auto-injectors (migraine) ? ?PA emgality complete  Waiting on approval  ?

## 2022-03-14 NOTE — Telephone Encounter (Signed)
Request Reference Number: [id number]. EMGALITY INJ '120MG'$ /ML is approved through 09/14/2022. Your patient may now fill this prescription and it will be covered. ? ? Will contact patient through mychart to make her aware of approval  ?

## 2022-03-17 ENCOUNTER — Ambulatory Visit: Payer: 59 | Admitting: Neurology

## 2022-05-31 HISTORY — PX: WISDOM TOOTH EXTRACTION: SHX21

## 2022-06-12 ENCOUNTER — Other Ambulatory Visit: Payer: Self-pay

## 2022-06-12 ENCOUNTER — Encounter (HOSPITAL_BASED_OUTPATIENT_CLINIC_OR_DEPARTMENT_OTHER): Payer: Self-pay | Admitting: Emergency Medicine

## 2022-06-12 ENCOUNTER — Observation Stay (HOSPITAL_BASED_OUTPATIENT_CLINIC_OR_DEPARTMENT_OTHER)
Admission: EM | Admit: 2022-06-12 | Discharge: 2022-06-14 | Disposition: A | Discharge status: Home / Self Care | Payer: 59 | Attending: Internal Medicine | Admitting: Internal Medicine

## 2022-06-12 DIAGNOSIS — F411 Generalized anxiety disorder: Secondary | ICD-10-CM | POA: Diagnosis present

## 2022-06-12 DIAGNOSIS — G2589 Other specified extrapyramidal and movement disorders: Secondary | ICD-10-CM | POA: Insufficient documentation

## 2022-06-12 DIAGNOSIS — G2579 Other drug induced movement disorders: Secondary | ICD-10-CM

## 2022-06-12 DIAGNOSIS — M273 Alveolitis of jaws: Secondary | ICD-10-CM | POA: Diagnosis present

## 2022-06-12 DIAGNOSIS — R112 Nausea with vomiting, unspecified: Secondary | ICD-10-CM | POA: Diagnosis present

## 2022-06-12 DIAGNOSIS — R000 Tachycardia, unspecified: Secondary | ICD-10-CM | POA: Diagnosis present

## 2022-06-12 DIAGNOSIS — T443X1A Poisoning by other parasympatholytics [anticholinergics and antimuscarinics] and spasmolytics, accidental (unintentional), initial encounter: Secondary | ICD-10-CM | POA: Diagnosis not present

## 2022-06-12 DIAGNOSIS — Z79899 Other long term (current) drug therapy: Secondary | ICD-10-CM | POA: Insufficient documentation

## 2022-06-12 DIAGNOSIS — G9081 Serotonin syndrome: Secondary | ICD-10-CM

## 2022-06-12 LAB — COMPREHENSIVE METABOLIC PANEL
ALT: 13 U/L (ref 0–44)
AST: 18 U/L (ref 15–41)
Albumin: 4.1 g/dL (ref 3.5–5.0)
Alkaline Phosphatase: 59 U/L (ref 38–126)
Anion gap: 9 (ref 5–15)
BUN: 8 mg/dL (ref 6–20)
CO2: 23 mmol/L (ref 22–32)
Calcium: 9.4 mg/dL (ref 8.9–10.3)
Chloride: 105 mmol/L (ref 98–111)
Creatinine, Ser: 0.75 mg/dL (ref 0.44–1.00)
GFR, Estimated: >60 mL/min (ref >60)
Glucose, Bld: 94 mg/dL (ref 70–99)
Potassium: 4.1 mmol/L (ref 3.5–5.1)
Sodium: 137 mmol/L (ref 135–145)
Total Bilirubin: 0.3 mg/dL (ref 0.3–1.2)
Total Protein: 8.6 g/dL — ABNORMAL HIGH (ref 6.5–8.1)

## 2022-06-12 LAB — CBC WITH DIFFERENTIAL/PLATELET
Abs Immature Granulocytes: 0.02 10*3/uL (ref 0.00–0.07)
Basophils Absolute: 0 10*3/uL (ref 0.0–0.1)
Basophils Relative: 0 %
Eosinophils Absolute: 0 10*3/uL (ref 0.0–0.5)
Eosinophils Relative: 0 %
HCT: 38.8 % (ref 36.0–46.0)
Hemoglobin: 13.4 g/dL (ref 12.0–15.0)
Immature Granulocytes: 0 %
Lymphocytes Relative: 11 %
Lymphs Abs: 0.9 10*3/uL (ref 0.7–4.0)
MCH: 29.9 pg (ref 26.0–34.0)
MCHC: 34.5 g/dL (ref 30.0–36.0)
MCV: 86.6 fL (ref 80.0–100.0)
Monocytes Absolute: 0.4 10*3/uL (ref 0.1–1.0)
Monocytes Relative: 5 %
Neutro Abs: 6.9 10*3/uL (ref 1.7–7.7)
Neutrophils Relative %: 84 %
Platelets: 263 10*3/uL (ref 150–400)
RBC: 4.48 MIL/uL (ref 3.87–5.11)
RDW: 12.2 % (ref 11.5–15.5)
WBC: 8.2 10*3/uL (ref 4.0–10.5)
nRBC: 0 % (ref 0.0–0.2)

## 2022-06-12 LAB — RAPID URINE DRUG SCREEN, HOSP PERFORMED
Amphetamines: NOT DETECTED
Barbiturates: NOT DETECTED
Benzodiazepines: NOT DETECTED
Cocaine: NOT DETECTED
Opiates: POSITIVE — AB
Tetrahydrocannabinol: NOT DETECTED

## 2022-06-12 LAB — URINALYSIS, ROUTINE W REFLEX MICROSCOPIC
Bilirubin Urine: NEGATIVE
Glucose, UA: NEGATIVE mg/dL
Ketones, ur: 40 mg/dL — AB
Leukocytes,Ua: NEGATIVE
Nitrite: NEGATIVE
Protein, ur: NEGATIVE mg/dL
Specific Gravity, Urine: 1.02 (ref 1.005–1.030)
pH: 6.5 (ref 5.0–8.0)

## 2022-06-12 LAB — URINALYSIS, MICROSCOPIC (REFLEX)

## 2022-06-12 LAB — PREGNANCY, URINE: Preg Test, Ur: NEGATIVE

## 2022-06-12 LAB — HCG, QUANTITATIVE, PREGNANCY: hCG, Beta Chain, Quant, S: <1 m[IU]/mL (ref <5)

## 2022-06-12 MED ORDER — SODIUM CHLORIDE 0.9 % IV BOLUS
500.0000 mL | Freq: Once | INTRAVENOUS | Status: AC
  Administered 2022-06-12: 500 mL via INTRAVENOUS

## 2022-06-12 MED ORDER — DIAZEPAM 5 MG/ML IJ SOLN
2.5000 mg | Freq: Once | INTRAMUSCULAR | Status: AC
  Administered 2022-06-12: 2.5 mg via INTRAVENOUS
  Filled 2022-06-12: qty 2

## 2022-06-12 MED ORDER — DIAZEPAM 5 MG/ML IJ SOLN: 5.0000 mg | Freq: Once | INTRAMUSCULAR | Status: DC

## 2022-06-12 NOTE — ED Notes (Signed)
Report called to [place] awaiting [facility]

## 2022-06-12 NOTE — ED Provider Notes (Signed)
[facility] Provider Note   CSN: [id number] Arrival date & time: 06/12/22  1746     History  Chief Complaint  Patient presents with   Tachycardia    Charlotte Bryant is a 21 y.o. female.  HPI   21 year old female presents with ED with complaints of tachycardia, palpitations, fever/chills, dilated eyes, feelings of nausea but not wanting to vomit, dizziness, lightheadedness.  Patient states she recently had her wisdom tooth removed this past Monday.  Since surgery, she has been on oxycodone as needed for pain.  She has noticed some increased nausea associated with oxycodone the longer she has been on medication.  She denies additional acetaminophen consumption with the Percocet.  She denies excessive use of Percocet and mother confirms and states that she has been keeping track of the medicine.  She states she is on Lexapro daily.  She has been taking Zofran increasingly over the past 3 to 4 days for nausea.  Her symptoms were discussed with the neighbor who is a provider and the concern is for potential serotonin syndrome.  She denies any overt abdominal pain, urinary symptoms, vaginal symptoms, chest pain, shortness of breath, recreational/illicit drug use.  Past medical history significant for migraine, depression, anxiety  Home Medications Prior to Admission medications   Medication Sig Start Date End Date Taking? Authorizing Provider  Galcanezumab-gnlm (EMGALITY) 120 MG/ML SOAJ Inject 120 mg into the skin every 30 (thirty) days. 03/10/22   Melvenia Beam, MD  NIKKI 3-0.02 MG tablet Take 1 tablet by mouth daily. 10/27/21   [provider]  ondansetron (ZOFRAN-ODT) 4 MG disintegrating tablet Take 1-2 tablets (4-8 mg total) by mouth every 8 (eight) hours as needed. 01/13/22   Melvenia Beam, MD  rizatriptan (MAXALT-MLT) 10 MG disintegrating tablet Take 1 tablet (10 mg total) by mouth as needed for migraine. May repeat in 2 hours if needed 01/13/22    Melvenia Beam, MD      Allergies    Doxycycline, Fructose, and Penicillins    Review of Systems   Review of Systems  All other systems reviewed and are negative.   Physical Exam Updated Vital Signs BP 116/73 (BP Location: Left Arm)   Pulse 88   Temp 99.3 F (37.4 C) (Oral)   Resp 16   Ht '5\' 2"'$  (1.575 m)   Wt 61 kg   LMP 05/29/2022   SpO2 99%   BMI 24.58 kg/m  Physical Exam Vitals and nursing note reviewed.  Constitutional:      General: She is not in acute distress.    Appearance: She is well-developed.     Comments: Patient appears flushed initially.  HENT:     Head: Normocephalic and atraumatic.     Right Ear: Tympanic membrane normal.     Left Ear: Tympanic membrane normal.     Nose: Nose normal.     Mouth/Throat:     Comments: No erythema or evidences of abscess formation in area of her recent tooth removed.  Small white patch noted on the lower right side where wisdom tooth was removed.  When questioned about said area, surgeon placed patch in dry socket.  No obvious erythematic posterior pharynx. Eyes:     Extraocular Movements: Extraocular movements intact.     Conjunctiva/sclera: Conjunctivae normal.     Pupils: Pupils are equal, round, and reactive to light.     Comments: Pupils roughly 7 mm in diameter bilaterally.  They are reactive.  Cardiovascular:  Rate and Rhythm: Regular rhythm. Tachycardia present.     Pulses: Normal pulses.     Heart sounds: No murmur heard. Pulmonary:     Effort: Pulmonary effort is normal. No respiratory distress.     Breath sounds: Normal breath sounds.  Abdominal:     Palpations: Abdomen is soft.     Tenderness: There is no abdominal tenderness. There is no guarding.  Musculoskeletal:        General: No swelling.     Cervical back: Neck supple. No rigidity or tenderness.     Right lower leg: No edema.     Left lower leg: No edema.  Skin:    General: Skin is warm and dry.     Capillary Refill: Capillary refill  takes less than 2 seconds.  Neurological:     Mental Status: She is alert.  Psychiatric:        Mood and Affect: Mood normal.     ED Results / Procedures / Treatments   Labs (all labs ordered are listed, but only abnormal results are displayed) Labs Reviewed  COMPREHENSIVE METABOLIC PANEL - Abnormal; Notable for the following components:      Result Value   Total Protein 8.6 (*)    All other components within normal limits  URINALYSIS, ROUTINE W REFLEX MICROSCOPIC - Abnormal; Notable for the following components:   Hgb urine dipstick LARGE (*)    Ketones, ur 40 (*)    All other components within normal limits  RAPID URINE DRUG SCREEN, HOSP PERFORMED - Abnormal; Notable for the following components:   Opiates POSITIVE (*)    All other components within normal limits  URINALYSIS, MICROSCOPIC (REFLEX) - Abnormal; Notable for the following components:   Bacteria, UA MANY (*)    All other components within normal limits  CBC WITH DIFFERENTIAL/PLATELET  HCG, QUANTITATIVE, PREGNANCY  PREGNANCY, URINE    EKG None  Radiology No results found.  Procedures Procedures    Medications Ordered in ED Medications  sodium chloride 0.9 % bolus 500 mL (0 mLs Intravenous Stopped 06/12/22 2130)  diazepam (VALIUM) injection 2.5 mg (2.5 mg Intravenous Given 06/12/22 2030)    ED Course/ Medical Decision Making/ A&P Clinical Course as of 06/13/22 0010  Sun Jun 12, 2022  2055 Reevaluation of the patient showed heart rate within normal range.  She is no longer symptomatic.  She feels back to baseline.  Pupils are still dilated approximately 7 mm. [CR]  2130 Dr. Babs Bertin of hospital medicine was consulted.  He agreed with admission of the patient and assuming further treatment/care. [CR]    Clinical Course User Index [CR] Wilnette Kales, PA                           Medical Decision Making Amount and/or Complexity of Data Reviewed Labs: ordered.  Risk Prescription drug  management. Decision regarding hospitalization.   This patient presents to the ED for concern of symptoms listed in HPI, this involves an extensive number of treatment options, and is a complaint that carries with it a high risk of complications and morbidity.  The differential diagnosis includes serotonin syndrome, influenza, COVID, Lyme, [place] spotted fever, neurologic malignant syndrome, pulmonary embolism   Co morbidities that complicate the patient evaluation  See HPI   Additional history obtained:  Additional history obtained from EMR  Lab Tests:  I Ordered, and personally interpreted labs.  The pertinent results include: No electrolyte abnormality  noted.  Renal function within normal limits, no elevation of liver enzymes.  No leukocytosis with no evidence of anemia.  Platelets within normal range.  UDS positive for opiates.  Patient is on at home oxycodone; doubt this is cause of patient's symptoms currently.  UA significant for many bacteria, 6-10 WBC and 6-10 squamous cells.  Likely a dirty catch.  No indication for being treated as CT.  Pregnancy is negative.   Imaging Studies ordered:  N/a   Cardiac Monitoring: / EKG:  The patient was maintained on a cardiac monitor.  I personally viewed and interpreted the cardiac monitored which showed an underlying rhythm of: Sinus tachycardia without ischemic changes or evidence of QTc prolongation   Consultations Obtained:  See ED course  Problem List / ED Course / Critical interventions / Medication management  Symptoms syndrome I ordered medication including 5 mL normal saline as well as 2.5 mg of Valium   Reevaluation of the patient after these medicines showed that the patient resolved I have reviewed the patients home medicines and have made adjustments as needed   Social Determinants of Health:  Denies alcohol, illicit drug use   Test / Admission - Considered:  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  This responded well to administration of Ativan and returned to within normal range. Otherwise within normal range and stable throughout visit. Laboratory/imaging studies significant for: See above Patient's symptoms likely secondary to presumed serotonin syndrome.  Given history of serotonin syndrome inducing medications as well as resolution of symptoms with the administration of the Ativan, this deemed as most likely cause.  Doubt pulmonary embolism given resolution of tachycardia with administration of Ativan, no lower extremity edema, no history of DVT/PE, no active chest pain.  Hospital medicine was consulted regarding patient he agreed with admission of the patient for observation of symptoms.  Treatment plan was discussed at length with daughter and mother who acknowledged understanding and were agreeable to said plan.  Patient was stable upon admission.        Final Clinical Impression(s) / ED Diagnoses Final diagnoses:  Serotonin syndrome    Rx / DC Orders ED Discharge Orders     None         Wilnette Kales, [place] 06/13/22 0010    Fredia Sorrow, MD 06/23/22 [phone]

## 2022-06-12 NOTE — ED Notes (Signed)
Pt feeling better, VSS up to restroom, ambulating well

## 2022-06-12 NOTE — ED Triage Notes (Signed)
Pt arrives pov, endorses oral surgery on Monday, endorse nausea today, woke up from nap today, and reports nausea, chills, and tachycardia with generalized body aches. Taking advil and tylenol. Took zofran at 1400 with little relief

## 2022-06-13 DIAGNOSIS — Z79899 Other long term (current) drug therapy: Secondary | ICD-10-CM | POA: Diagnosis not present

## 2022-06-13 DIAGNOSIS — M273 Alveolitis of jaws: Secondary | ICD-10-CM

## 2022-06-13 DIAGNOSIS — F411 Generalized anxiety disorder: Secondary | ICD-10-CM | POA: Diagnosis not present

## 2022-06-13 DIAGNOSIS — T443X1A Poisoning by other parasympatholytics [anticholinergics and antimuscarinics] and spasmolytics, accidental (unintentional), initial encounter: Secondary | ICD-10-CM | POA: Diagnosis present

## 2022-06-13 DIAGNOSIS — R112 Nausea with vomiting, unspecified: Secondary | ICD-10-CM | POA: Diagnosis not present

## 2022-06-13 DIAGNOSIS — G2589 Other specified extrapyramidal and movement disorders: Secondary | ICD-10-CM | POA: Diagnosis not present

## 2022-06-13 LAB — CBC WITH DIFFERENTIAL/PLATELET
Abs Immature Granulocytes: 0.01 10*3/uL (ref 0.00–0.07)
Basophils Absolute: 0 10*3/uL (ref 0.0–0.1)
Basophils Relative: 1 %
Eosinophils Absolute: 0.1 10*3/uL (ref 0.0–0.5)
Eosinophils Relative: 2 %
HCT: 34.6 % — ABNORMAL LOW (ref 36.0–46.0)
Hemoglobin: 11.9 g/dL — ABNORMAL LOW (ref 12.0–15.0)
Immature Granulocytes: 0 %
Lymphocytes Relative: 41 %
Lymphs Abs: 2.5 10*3/uL (ref 0.7–4.0)
MCH: 30.3 pg (ref 26.0–34.0)
MCHC: 34.4 g/dL (ref 30.0–36.0)
MCV: 88 fL (ref 80.0–100.0)
Monocytes Absolute: 0.6 10*3/uL (ref 0.1–1.0)
Monocytes Relative: 9 %
Neutro Abs: 3 10*3/uL (ref 1.7–7.7)
Neutrophils Relative %: 47 %
Platelets: 264 10*3/uL (ref 150–400)
RBC: 3.93 MIL/uL (ref 3.87–5.11)
RDW: 12.3 % (ref 11.5–15.5)
WBC: 6.1 10*3/uL (ref 4.0–10.5)
nRBC: 0 % (ref 0.0–0.2)

## 2022-06-13 LAB — COMPREHENSIVE METABOLIC PANEL
ALT: 12 U/L (ref 0–44)
AST: 13 U/L — ABNORMAL LOW (ref 15–41)
Albumin: 3.5 g/dL (ref 3.5–5.0)
Alkaline Phosphatase: 44 U/L (ref 38–126)
Anion gap: 8 (ref 5–15)
BUN: 6 mg/dL (ref 6–20)
CO2: 21 mmol/L — ABNORMAL LOW (ref 22–32)
Calcium: 9.1 mg/dL (ref 8.9–10.3)
Chloride: 110 mmol/L (ref 98–111)
Creatinine, Ser: 0.72 mg/dL (ref 0.44–1.00)
GFR, Estimated: >60 mL/min (ref >60)
Glucose, Bld: 79 mg/dL (ref 70–99)
Potassium: 3.5 mmol/L (ref 3.5–5.1)
Sodium: 139 mmol/L (ref 135–145)
Total Bilirubin: 0.5 mg/dL (ref 0.3–1.2)
Total Protein: 6.9 g/dL (ref 6.5–8.1)

## 2022-06-13 LAB — HIV ANTIBODY (ROUTINE TESTING W REFLEX): HIV Screen 4th Generation wRfx: NONREACTIVE

## 2022-06-13 LAB — MAGNESIUM: Magnesium: 1.9 mg/dL (ref 1.7–2.4)

## 2022-06-13 MED ORDER — LACTATED RINGERS IV BOLUS
1000.0000 mL | Freq: Once | INTRAVENOUS | Status: AC
  Administered 2022-06-13: 1000 mL via INTRAVENOUS

## 2022-06-13 MED ORDER — ALPRAZOLAM 0.25 MG PO TABS
0.2500 mg | ORAL_TABLET | Freq: Three times a day (TID) | ORAL | Status: DC | PRN
  Administered 2022-06-13 – 2022-06-14 (×2): 0.25 mg via ORAL
  Filled 2022-06-13 (×2): qty 1

## 2022-06-13 MED ORDER — ESCITALOPRAM OXALATE 20 MG PO TABS
20.0000 mg | ORAL_TABLET | Freq: Every day | ORAL | Status: DC
  Administered 2022-06-13 – 2022-06-14 (×2): 20 mg via ORAL
  Filled 2022-06-13 (×2): qty 1

## 2022-06-13 MED ORDER — ONDANSETRON HCL 4 MG/2ML IJ SOLN
4.0000 mg | Freq: Four times a day (QID) | INTRAMUSCULAR | Status: DC | PRN
  Administered 2022-06-13 – 2022-06-14 (×2): 4 mg via INTRAVENOUS
  Filled 2022-06-13 (×2): qty 2

## 2022-06-13 MED ORDER — ALUM & MAG HYDROXIDE-SIMETH 200-200-20 MG/5ML PO SUSP
30.0000 mL | ORAL | Status: DC | PRN
  Administered 2022-06-14: 30 mL via ORAL
  Filled 2022-06-13: qty 30

## 2022-06-13 MED ORDER — LACTATED RINGERS IV SOLN: INTRAVENOUS | Status: DC

## 2022-06-13 MED ORDER — LACTATED RINGERS IV SOLN
INTRAVENOUS | Status: DC
Start: 2022-06-13 — End: 2022-06-14

## 2022-06-13 MED ORDER — ACETAMINOPHEN 650 MG RE SUPP: 650.0000 mg | Freq: Four times a day (QID) | RECTAL | Status: DC | PRN

## 2022-06-13 MED ORDER — ENOXAPARIN SODIUM 40 MG/0.4ML IJ SOSY: 40.0000 mg | PREFILLED_SYRINGE | INTRAMUSCULAR | Status: DC

## 2022-06-13 MED ORDER — PANTOPRAZOLE SODIUM 40 MG IV SOLR
40.0000 mg | Freq: Every day | INTRAVENOUS | Status: DC
  Administered 2022-06-13: 40 mg via INTRAVENOUS
  Filled 2022-06-13: qty 10

## 2022-06-13 MED ORDER — ACETAMINOPHEN 325 MG PO TABS
650.0000 mg | ORAL_TABLET | Freq: Four times a day (QID) | ORAL | Status: DC | PRN
  Administered 2022-06-13: 650 mg via ORAL
  Filled 2022-06-13: qty 2

## 2022-06-13 MED ORDER — POLYETHYLENE GLYCOL 3350 17 G PO PACK: 17.0000 g | PACK | Freq: Every day | ORAL | Status: DC | PRN

## 2022-06-13 MED ORDER — ONDANSETRON HCL 4 MG PO TABS: 4.0000 mg | ORAL_TABLET | Freq: Four times a day (QID) | ORAL | Status: DC | PRN

## 2022-06-13 MED ORDER — RIZATRIPTAN BENZOATE 10 MG PO TBDP
10.0000 mg | ORAL_TABLET | ORAL | Status: DC | PRN
Start: 2022-06-13 — End: 2022-06-13

## 2022-06-13 MED ORDER — SUMATRIPTAN SUCCINATE 50 MG PO TABS
50.0000 mg | ORAL_TABLET | ORAL | Status: DC | PRN
  Administered 2022-06-13: 50 mg via ORAL
  Filled 2022-06-13 (×2): qty 1

## 2022-06-13 NOTE — Progress Notes (Signed)
Patient admitted early this morning for tachycardia, emesis with possible anticholinergic drug overdose.  Patient seen at bedside and plan of care discussed with her.  Continue IV fluids.  Advance diet as tolerated.

## 2022-06-13 NOTE — Discharge Summary (Signed)
Physician Discharge Summary  Charlotte Bryant ULA:[id number] DOB: 06-Jun-2001 DOA: 06/12/2022  PCP: Hinton Lovely, NP  Admit date: 06/12/2022 Discharge date: 06/14/2022  Admitted From: Home Disposition: Home  Recommendations for Outpatient Follow-up:  Follow up with PCP in 1 week  Outpatient follow-up with dentist as needed Follow up in ED if symptoms worsen or new appear   Home Health: No Equipment/Devices: None  Discharge Condition: Stable CODE STATUS: Full Diet recommendation: Regular  Brief/Interim Summary: 21 year old female with history of generalized anxiety disorder, migraine headaches presented with tremulousness, tachycardia, vomiting after being on Phenergan for the last few days for vomiting.  On presentation, patient was tachycardic and was found to have mild reassess.  There was a concern for anticholinergic drug overdose.  She was treated with IV fluids and observation.  Condition has improved.  Diet has been advanced.  She is tolerating diet.  She will be discharged home today with outpatient follow-up with PCP.  DC Phenergan.  Use Zofran as needed.  Discharge Diagnoses:   Concern for anticholinergic drug overdose -Presented with tachycardia, tremulousness, dry mouth and significant mydriasis -Patient was on phentermine as an outpatient for the last few days.  Also takes Lexapro at home. -Treated conservatively with IV fluids and close observation. -Symptoms improving.  DC Phenergan. -Outpatient follow-up with PCP.  Nausea and vomiting -Improving.  Treated with IV fluids and Zofran.  Diet has been advanced and patient has tolerated. -Continue Zofran as needed.  DC Phenergan. -Patient was also started on IV Protonix during the hospitalization for abdominal pain along with as needed Maalox.  Will discharge on Protonix once a day for 2 weeks and as needed Maalox.  Generalized anxiety disorder -Continue Lexapro.  Outpatient follow-up. -Patient was put on low-dose  Xanax during the hospitalization because of increased anxiety.  Will discharge on few days of Xanax 0.25 mg 3 times a day as needed.  Will need follow-up with PCP regarding the same.  Recent wisdom tooth extraction -Subsequently had pain for which patient underwent flap placement by dentist.  No signs of any oral disease or infection.  Outpatient follow-up with dentist.  Try nonopiate-based analgesics.   Discharge Instructions   Allergies as of 06/14/2022       Reactions   Doxycycline Other (See Comments)   Fever, sweats and chills   Fructose Other (See Comments)   Digestive problems   Penicillins Rash   Childhood reaction        Medication List     STOP taking these medications    clindamycin 150 MG capsule Commonly known as: CLEOCIN   HYDROcodone-acetaminophen 5-325 MG tablet Commonly known as: NORCO/VICODIN   HYDROcodone-acetaminophen 7.5-325 MG tablet Commonly known as: NORCO   promethazine 12.5 MG tablet Commonly known as: PHENERGAN       TAKE these medications    ALPRAZolam 0.25 MG tablet Commonly known as: XANAX Take 1 tablet (0.25 mg total) by mouth 3 (three) times daily as needed for anxiety.   alum & mag hydroxide-simeth 200-200-20 MG/5ML suspension Commonly known as: MAALOX/MYLANTA Take 30 mLs by mouth every 4 (four) hours as needed for indigestion or heartburn.   Emgality 120 MG/ML Soaj Generic drug: Galcanezumab-gnlm Inject 120 mg into the skin every 30 (thirty) days.   escitalopram 20 MG tablet Commonly known as: LEXAPRO Take 20 mg by mouth daily.   ibuprofen 600 MG tablet Commonly known as: ADVIL Take 600 mg by mouth every 6 (six) hours as needed (pain).   Nikki 3-0.02 MG tablet  Generic drug: drospirenone-ethinyl estradiol Take 1 tablet by mouth daily.   ondansetron 4 MG disintegrating tablet Commonly known as: ZOFRAN-ODT Take 1 tablet (4 mg total) by mouth every 6 (six) hours as needed. What changed:  how much to take when to  take this   pantoprazole 40 MG tablet Commonly known as: Protonix Take 1 tablet (40 mg total) by mouth daily for 15 days.   rizatriptan 10 MG disintegrating tablet Commonly known as: MAXALT-MLT Take 1 tablet (10 mg total) by mouth as needed for migraine. May repeat in 2 hours if needed   TYLENOL PO Take 2 tablets by mouth every 6 (six) hours as needed (pain).           Follow-up Information     Hinton Lovely, NP. Schedule an appointment as soon as possible for a visit in 1 week(s).   Specialty: Nurse Practitioner Contact information: [place] [address] [phone]                Allergies  Allergen Reactions   Doxycycline Other (See Comments)    Fever, sweats and chills   Fructose Other (See Comments)    Digestive problems   Penicillins Rash    Childhood reaction    Consultations: None   Procedures/Studies: No results found.    Subjective: Patient seen and examined at bedside.  Feeling better.  No overnight fever, chest pain reported.  Discharge Exam: Vitals:   06/13/22 0346 06/13/22 0821  BP: 114/62 109/66  Pulse: 81 100  Resp: 16 18  Temp: 98.5 F (36.9 C) 98.7 F (37.1 C)  SpO2: 97% 100%    General: Pt is alert, awake, not in acute distress Cardiovascular: rate controlled, S1/S2 + Respiratory: bilateral decreased breath sounds at bases Abdominal: Soft, NT, ND, bowel sounds + Extremities: no edema, no cyanosis    The results of significant diagnostics from this hospitalization (including imaging, microbiology, ancillary and laboratory) are listed below for reference.     Microbiology: No results found for this or any previous visit (from the past 240 hour(s)).   Labs: BNP (last 3 results) No results for input(s): "BNP" in the last 8760 hours. Basic Metabolic Panel: Recent Labs  Lab 06/12/22 2010 06/13/22 0751  NA 137 139  K 4.1 3.5  CL 105 110  CO2 23 21*  GLUCOSE 94 79  BUN 8 6   CREATININE 0.75 0.72  CALCIUM 9.4 9.1  MG  --  1.9   Liver Function Tests: Recent Labs  Lab 06/12/22 2010 06/13/22 0751  AST 18 13*  ALT 13 12  ALKPHOS 59 44  BILITOT 0.3 0.5  PROT 8.6* 6.9  ALBUMIN 4.1 3.5   No results for input(s): "LIPASE", "AMYLASE" in the last 168 hours. No results for input(s): "AMMONIA" in the last 168 hours. CBC: Recent Labs  Lab 06/12/22 2010 06/13/22 0751  WBC 8.2 6.1  NEUTROABS 6.9 3.0  HGB 13.4 11.9*  HCT 38.8 34.6*  MCV 86.6 88.0  PLT 263 264   Cardiac Enzymes: No results for input(s): "CKTOTAL", "CKMB", "CKMBINDEX", "TROPONINI" in the last 168 hours. BNP: Invalid input(s): "POCBNP" CBG: No results for input(s): "GLUCAP" in the last 168 hours. D-Dimer No results for input(s): "DDIMER" in the last 72 hours. Hgb A1c No results for input(s): "HGBA1C" in the last 72 hours. Lipid Profile No results for input(s): "CHOL", "HDL", "LDLCALC", "TRIG", "CHOLHDL", "LDLDIRECT" in the last 72 hours. Thyroid function studies No results for input(s): "TSH", "T4TOTAL", "T3FREE", "  THYROIDAB" in the last 72 hours.  Invalid input(s): "FREET3" Anemia work up No results for input(s): "VITAMINB12", "FOLATE", "FERRITIN", "TIBC", "IRON", "RETICCTPCT" in the last 72 hours. Urinalysis    Component Value Date/Time   COLORURINE YELLOW 06/12/2022 2010   APPEARANCEUR CLEAR 06/12/2022 2010   LABSPEC 1.020 06/12/2022 2010   PHURINE 6.5 06/12/2022 2010   GLUCOSEU NEGATIVE 06/12/2022 2010   HGBUR LARGE (A) 06/12/2022 2010   BILIRUBINUR NEGATIVE 06/12/2022 2010   BILIRUBINUR negative 05/22/2020 1706   KETONESUR 40 (A) 06/12/2022 2010   PROTEINUR NEGATIVE 06/12/2022 2010   UROBILINOGEN 0.2 05/22/2020 1706   NITRITE NEGATIVE 06/12/2022 2010   LEUKOCYTESUR NEGATIVE 06/12/2022 2010   Sepsis Labs Recent Labs  Lab 06/12/22 2010 06/13/22 0751  WBC 8.2 6.1   Microbiology No results found for this or any previous visit (from the past 240 hour(s)).   Time  coordinating discharge: 35 minutes  SIGNED:   Aline August, MD  [facility] 06/14/2022, 1:58 PM

## 2022-06-13 NOTE — H&P (Signed)
History and Physical    Patient: Charlotte Bryant MRN: [id number] DOA: 06/12/2022  Date of Service: the patient was seen and examined on 06/13/2022  Patient coming from: Home  Chief Complaint:  Chief Complaint  Patient presents with   Tachycardia    HPI:   21 year old female with past medical history of generalized anxiety disorder, migraine headaches presenting to [facility] emergency department with complaints of mydriasis tremulousness and tachycardia.  Patient explains that last Monday she underwent wisdom tooth extraction.  In the days that followed patient had a significant amount of oral pain and had to return to her dentist for close placement within 24 to 48 hours after the procedure.  Patient continued to experience significant pain and nausea after the procedure and was using Tylenol ibuprofen and Zofran daily.  On Friday, the patient had substituted Zofran for Phenergan and was regularly using this on Friday Saturday and Sunday.  Patient reports that she woke up from a nap on Sunday afternoon experiencing tremulousness palpitations and agitation.  Patient also noticed that she was diaphoretic.  Family was noted the patient had significant mydriasis.  Patient also vomited once but denies any associated abdominal pain.  Patient denies any fevers or diarrhea.  Due to this constellation of symptoms patient presented to [facility] emergency department for evaluation.  Upon evaluation in the [facility] emergency department patient was found to notably be tachycardic.  There was initial concern for possible serotonin syndrome.  Patient was initiated on intravenous fluids and due to patient's tachycardia and dramatic presentation EDP requested hospitalization for overnight observation.  The hospitalist group was contacted and patient was excepted for transfer to [facility] long hospital for continued medical care.   Review of Systems: Review of Systems   Constitutional:  Positive for malaise/fatigue.  Cardiovascular:  Positive for palpitations.  Gastrointestinal:  Positive for nausea and vomiting.  Neurological:  Positive for tremors.     Past Medical History:  Diagnosis Date   Anxiety    Depression    Migraine     History reviewed. No pertinent surgical history.  Social History:  reports that she has never smoked. She has never used smokeless tobacco. She reports that she does not drink alcohol and does not use drugs.  Allergies  Allergen Reactions   Doxycycline Other (See Comments)    Fever, sweats and chills   Fructose Other (See Comments)    Digestive problems   Penicillins Rash    Childhood reaction    Family History  Problem Relation Age of Onset   Migraines Mother    High blood pressure Other    High Cholesterol Other    Anxiety disorder Other     Prior to Admission medications   Medication Sig Start Date End Date Taking? Authorizing Provider  Acetaminophen (TYLENOL PO) Take 2 tablets by mouth every 6 (six) hours as needed (pain).   Yes [provider]  escitalopram (LEXAPRO) 20 MG tablet Take 20 mg by mouth daily. 05/15/22  Yes [provider]  Galcanezumab-gnlm (EMGALITY) 120 MG/ML SOAJ Inject 120 mg into the skin every 30 (thirty) days. 03/10/22  Yes Melvenia Beam, MD  ibuprofen (ADVIL) 600 MG tablet Take 600 mg by mouth every 6 (six) hours as needed (pain). 04/19/22  Yes [provider]  NIKKI 3-0.02 MG tablet Take 1 tablet by mouth daily. 10/27/21  Yes [provider]  promethazine (PHENERGAN) 12.5 MG tablet Take 12.5 mg by mouth See admin instructions. Take  12.5 mg by mouth 2-3 times a day as needed for nausea and vomiting.   Yes [provider]  clindamycin (CLEOCIN) 150 MG capsule Take 150 mg by mouth 3 (three) times daily. Patient not taking: Reported on 06/13/2022 06/06/22   [provider]  HYDROcodone-acetaminophen ([place]) 7.5-325 MG tablet Take by  mouth. Patient not taking: Reported on 06/13/2022 04/19/22   [provider]  HYDROcodone-acetaminophen (NORCO/VICODIN) 5-325 MG tablet Take 1 tablet by mouth every 6 (six) hours as needed. Patient not taking: Reported on 06/13/2022 06/06/22   [provider]  ondansetron (ZOFRAN-ODT) 4 MG disintegrating tablet Take 1-2 tablets (4-8 mg total) by mouth every 8 (eight) hours as needed. Patient not taking: Reported on 06/13/2022 01/13/22   Melvenia Beam, MD  rizatriptan (MAXALT-MLT) 10 MG disintegrating tablet Take 1 tablet (10 mg total) by mouth as needed for migraine. May repeat in 2 hours if needed Patient not taking: Reported on 06/13/2022 01/13/22   Melvenia Beam, MD    Physical Exam:  Vitals:   06/12/22 2300 06/13/22 0006 06/13/22 0346 06/13/22 0821  BP: 120/80 116/73 114/62 109/66  Pulse: (!) 107 88 81 100  Resp: '18 16 16 18  '$ Temp:  99.3 F (37.4 C) 98.5 F (36.9 C) 98.7 F (37.1 C)  TempSrc:  Oral Oral Oral  SpO2: 100% 99% 97% 100%  Weight:  61 kg    Height:  '5\' 2"'$  (1.575 m)      Constitutional: Awake alert and oriented x3, patient is in mild distress due to oral discomfort Skin: no rashes, no lesions, good skin turgor noted. Eyes: Sig if cannot mydriasis noted however pupils are equally reactive to light.    No evidence of scleral icterus or conjunctival pallor.  ENMT: Dry mucous membranes noted.  Mild tenderness noted along the right mandible and maxilla without fluctuance or notable discharge on oral visualization.  Posterior pharynx clear of any exudate or lesions.   Neck: normal, supple, no masses, no thyromegaly.  No evidence of jugular venous distension.   Respiratory: clear to auscultation bilaterally, no wheezing, no crackles. Normal respiratory effort. No accessory muscle use.  Cardiovascular: tachycardic and regular no murmurs / rubs / gallops. No extremity edema. 2+ pedal pulses. No carotid bruits.  Chest:   Nontender without crepitus or deformity.    Back:   Nontender without crepitus or deformity. Abdomen: Abdomen is soft and nontender.  No evidence of intra-abdominal masses.  Positive bowel sounds noted in all quadrants.   Musculoskeletal: No joint deformity upper and lower extremities. Good ROM, no contractures. Normal muscle tone.  Neurologic: CN 2-12 grossly intact. Sensation intact.  Patient moving all 4 extremities spontaneously.  Patient is following all commands.  Patient is responsive to verbal stimuli.   Psychiatric: Patient exhibits normal mood with appropriate affect.  Patient seems to possess insight as to their current situation.    Data Reviewed:  I have personally reviewed and interpreted labs, imaging.  Significant findings are:  Lab Results  Component Value Date   WBC 6.1 06/13/2022   HGB 11.9 (L) 06/13/2022   HCT 34.6 (L) 06/13/2022   MCV 88.0 06/13/2022   PLT 264 06/13/2022     EKG: Personally reviewed.  Rhythm is sinus tachycardia with heart rate of 136 bpm.  No dynamic ST segment changes appreciated.   Assessment and Plan: * Anticholinergic drug overdose Patient presenting with significant tachycardia, tremulousness, dry mouth and significant mydriasis After thorough review of the medications the patient has  been taking over the past several days (Lexapro, Zofran, Promethazine, Tylenol, Ibuprofen) it seems that the patient has regularly been taking promethazine instead of Zofran for the past 3 days prior to her presentation. Therefore I believe the constellation of symptoms above is due to unintentional mild anticholinergic drug overdose Hydrating patient with intravenous isotonic fluids Performing serial EKGs Monitoring with serial chemistries Symptoms should be self-limiting with anticipation the patient to be discharged within the next 24 hours  Nausea and vomiting As needed Zofran for ongoing symptoms of nausea and vomiting Avoiding anticholinergics such as Phenergan and Compazine for now Clear  liquid diet for now, advancing diet as tolerated Remainder of management as above  Generalized anxiety disorder We will resume home regimen of Lexapro as I do not believe that this is the cause of the patient's presentation  Dry tooth socket Patient experienced significant local pain after wisdom tooth extraction last week Patient underwent clove placement by her dentist 24 to 48 hours after the procedure with some minimal pain but still has significant soreness of the jaw and limitation of range of motion. On physical exam there is no concern for underlying odontogenic infection Supportive care, as needed analgesics, preferably nonopiate-based analgesics. Advancing diet as tolerated       Code Status:  Full code  code status decision has been confirmed with: patient Family Communication: Daughter is at bedside and has been updated on plan of care  Consults: none  Severity of Illness:  The appropriate patient status for this patient is OBSERVATION. Observation status is judged to be reasonable and necessary in order to provide the required intensity of service to ensure the patient's safety. The patient's presenting symptoms, physical exam findings, and initial radiographic and laboratory data in the context of their medical condition is felt to place them at decreased risk for further clinical deterioration. Furthermore, it is anticipated that the patient will be medically stable for discharge from the hospital within 2 midnights of admission.   Author:  Vernelle Emerald MD  06/13/2022 8:58 AM

## 2022-06-13 NOTE — Assessment & Plan Note (Signed)
   As needed Zofran for ongoing symptoms of nausea and vomiting  Avoiding anticholinergics such as Phenergan and Compazine for now  Clear liquid diet for now, advancing diet as tolerated  Remainder of management as above

## 2022-06-13 NOTE — Assessment & Plan Note (Signed)
   We will resume home regimen of Lexapro as I do not believe that this is the cause of the patient's presentation

## 2022-06-13 NOTE — Assessment & Plan Note (Signed)
   Patient presenting with significant tachycardia, tremulousness, dry mouth and significant mydriasis  After thorough review of the medications the patient has been taking over the past several days (Lexapro, Zofran, Promethazine, Tylenol, Ibuprofen) it seems that the patient has regularly been taking promethazine instead of Zofran for the past 3 days prior to her presentation.  Therefore I believe the constellation of symptoms above is due to unintentional mild anticholinergic drug overdose  Hydrating patient with intravenous isotonic fluids  Performing serial EKGs  Monitoring with serial chemistries  Symptoms should be self-limiting with anticipation the patient to be discharged within the next 24 hours

## 2022-06-13 NOTE — Assessment & Plan Note (Signed)
   Patient experienced significant local pain after wisdom tooth extraction last week  Patient underwent clove placement by her dentist 24 to 48 hours after the procedure with some minimal pain but still has significant soreness of the jaw and limitation of range of motion.  On physical exam there is no concern for underlying odontogenic infection  Supportive care, as needed analgesics, preferably nonopiate-based analgesics.  Advancing diet as tolerated

## 2022-06-13 NOTE — Progress Notes (Signed)
Transferring facility: [facility] Requesting provider: Dion Saucier, PA (EDP at [facility]) Reason for transfer: admission for further evaluation and management of intractable nausea/vomiting.   21 year old female who presented to [facility] ED on 06/12/22 complaining of nausea/vomiting.  She had undergone wisdom teeth extraction on Monday, 06/06/2022.  Has been taking prn Percocet for pain control. however over the last few days has developed persistent nausea/vomiting, refractory to 4-5 doses of Zofran over the last 24 hours, with associated limited ability to tolerate any p.o. over that timeframe, including limiting her ability to tolerate her prn Percocet for pain control.  Not associated w/ any fever or chills.  However, she notes associated dizziness, lightheadedness, as well as palpitations.  No abdominal pain, diarrhea.  No hematemesis.  Vital signs in the ED were notable for the following: Initial heart rate 116, a/w sinus tachycardia, improving into the 80s following IV fluids, as below.  Blood pressure 120/77.  Oxygen saturation 100% on room air.  Afebrile.  Medications administered prior to transfer included the following: Normal saline x500 cc bolus.  There was also some concern for potential early serotonin syndrome, as the patient is also on Lexapro, which she has been taking over the last 4 years.  She received a dose of diazepam 2.5 mg IV x1 in the ED.   Subsequently, I accepted this patient for transfer for observation to a med telemetry bed at [facility] or [facility] (first available) for further work-up and management of intractable nausea/vomiting.        Check [email or web address] for on-call coverage.   Nursing staff, Please call [place] number on Amion as soon as patient's arrival, so appropriate admitting provider can evaluate the pt.     Babs Bertin, DO Hospitalist

## 2022-06-13 NOTE — Progress Notes (Signed)
  Transition of Care [facility]) Screening Note   Patient Details  Name: Charlotte Bryant Date of Birth: 2001-09-27   Transition of Care [facility]) CM/SW Contact:    Vassie Moselle, LCSW Phone Number: 06/13/2022, 12:26 PM    Transition of Care Department [facility]) has reviewed patient and no TOC needs have been identified at this time. We will continue to monitor patient advancement through interdisciplinary progression rounds. If new patient transition needs arise, please place a TOC consult.

## 2022-06-13 NOTE — Plan of Care (Signed)
  Problem: Education: Goal: Knowledge of General Education information will improve Description Including pain rating scale, medication(s)/side effects and non-pharmacologic comfort measures Outcome: Progressing   Problem: Health Behavior/Discharge Planning: Goal: Ability to manage health-related needs will improve Outcome: Progressing   

## 2022-06-14 DIAGNOSIS — R112 Nausea with vomiting, unspecified: Secondary | ICD-10-CM | POA: Diagnosis not present

## 2022-06-14 DIAGNOSIS — T443X1A Poisoning by other parasympatholytics [anticholinergics and antimuscarinics] and spasmolytics, accidental (unintentional), initial encounter: Secondary | ICD-10-CM | POA: Diagnosis not present

## 2022-06-14 DIAGNOSIS — F411 Generalized anxiety disorder: Secondary | ICD-10-CM | POA: Diagnosis not present

## 2022-06-14 MED ORDER — ALPRAZOLAM 0.25 MG PO TABS: 0.2500 mg | ORAL_TABLET | Freq: Three times a day (TID) | ORAL | 0 refills | Status: DC | PRN

## 2022-06-14 MED ORDER — ALUM & MAG HYDROXIDE-SIMETH 200-200-20 MG/5ML PO SUSP: 30.0000 mL | ORAL | 0 refills | Status: DC | PRN

## 2022-06-14 MED ORDER — PANTOPRAZOLE SODIUM 40 MG IV SOLR
40.0000 mg | Freq: Two times a day (BID) | INTRAVENOUS | Status: DC
  Administered 2022-06-14: 40 mg via INTRAVENOUS
  Filled 2022-06-14: qty 10

## 2022-06-14 MED ORDER — ONDANSETRON 4 MG PO TBDP: 4.0000 mg | ORAL_TABLET | Freq: Four times a day (QID) | ORAL | 0 refills | Status: AC | PRN

## 2022-06-14 MED ORDER — PANTOPRAZOLE SODIUM 40 MG PO TBEC: 40.0000 mg | DELAYED_RELEASE_TABLET | Freq: Every day | ORAL | 0 refills | Status: DC

## 2023-02-06 ENCOUNTER — Telehealth: Payer: Self-pay | Admitting: Neurology

## 2023-02-06 NOTE — Telephone Encounter (Signed)
LVM and sent mychart msg informing pt of video visit made with Dr. Lucia Gaskins

## 2023-02-06 NOTE — Telephone Encounter (Signed)
Can you set up a video before May 24th can add a 4pm n a monday

## 2023-02-27 ENCOUNTER — Telehealth (INDEPENDENT_AMBULATORY_CARE_PROVIDER_SITE_OTHER): Payer: 59 | Admitting: Neurology

## 2023-02-27 DIAGNOSIS — G43009 Migraine without aura, not intractable, without status migrainosus: Secondary | ICD-10-CM | POA: Diagnosis not present

## 2023-02-27 MED ORDER — NURTEC 75 MG PO TBDP: 75.0000 mg | ORAL_TABLET | Freq: Every day | ORAL | 11 refills | Status: AC | PRN

## 2023-02-27 NOTE — Progress Notes (Unsigned)
[facility]   Virtual Visit via Video Note  I connected with Charlotte Bryant on 02/28/23 at  4:00 PM EDT by a video enabled telemedicine application and verified that I am speaking with the correct person using two identifiers.  Location: Patient: school at [employer] in class Provider: office   I discussed the limitations of evaluation and management by telemedicine and the availability of in person appointments. The patient expressed understanding and agreed to proceed.   Follow Up Instructions:    I discussed the assessment and treatment plan with the patient. The patient was provided an opportunity to ask questions and all were answered. The patient agreed with the plan and demonstrated an understanding of the instructions.   The patient was advised to call back or seek an in-person evaluation if the symptoms worsen or if the condition fails to improve as anticipated.  I provided over 30 minutes of non-face-to-face time during this encounter.   Anson Fret, MD   Provider:  Dr Lucia Gaskins Requesting Provider: Hessie Diener, NP Primary Care Provider:  Hessie Diener, NP  CC:  Migraines and AVM  02/27/2023:When I saw her initially she was having bout >10 migraine days a month and daily headaches for > 7 months.The emgality helped tremendously she missed dose in March but now is a stress full and still better by at least 50% was better before the stress. She has 5 migraines a month and < 10 total headache days a month will prescribe nurtec.   She has 5 total migraines a month and < 10 total headache days a month will prescribe nurtec. Tried Maxalt, imitrex, ubrelvy  Patient complains of symptoms per HPI as well as the following symptoms: migraines . Pertinent negatives and positives per HPI. All others negative   HPI:  Charlotte Bryant is a 22 y.o. female here as requested by Hessie Diener, NP for migraine. PMHx migraines, anxiety, ocd(skin picking).    She has had headaches for most of her life, gotten worse since she went to college [employer]. She had one that was so bad she couldn't see it was so blurry, then half hour later she had the worst headache and then slept. In the temples, throbbing/pulsating, light and sound sensitivity, nausea and dizziness she is dizzy a lot. Very fatigued, can never get enough sleep, she naps a lot. About 10 migraine days a month and daily headaches for > 7 months. Migraines can last up to 24 hours, she sleeps them off and takes ibuprofen/advil. We discussed migraines in detail today, acute and preventative management. I gave her lots of literature to read on migraine associated vertigo, migraine aura, gave her an introductory migraine patient information. Spend extended visit educating patient. 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 Her headaches can be in the morning, they can be positional and exertional.Discussed imaging and possible migraine treatment. Possible migraine aura, discussed risk of stroke with patient. No other focal neurologic deficits, associated symptoms, inciting events or modifiable factors. Mother is here today and provides much information and discussed options and plans with mother as well.   Reviewed notes, labs and imaging from outside physicians, which showed:   TSH 2.16 normal, cmp normal BUN 9 creat 0.75, CBC nml, Vit D(OH) 23  Thorough review of records, medications tried that can be used in migraine management includes: Zofran, Lexapro, Cymbalta, ibuprofen, amitriptyline, Bp med contraindicated due to hypotension, topiramate  contraindicated due to depression, low BMI, mood disorder (topiramate can cause worsening depression and weight loss). Maxalt, imitrex, ubrelvy  MRI and MRA::This study shows a small area of signal change in the anterior temporal lobe on the right suggesting a vascular pattern. However there is no  enhancement. The possibility of AV malformation is to be considered and MR angiogram may be indicated  Review of Systems: Patient complains of symptoms per HPI as well as the following symptoms mood disorder. Pertinent negatives and positives per HPI. All others negative.   Social History   Socioeconomic History   Marital status: Single    Spouse name: Not on file   Number of children: Not on file   Years of education: Not on file   Highest education level: Not on file  Occupational History   Not on file  Tobacco Use   Smoking status: Never   Smokeless tobacco: Never  Vaping Use   Vaping Use: Never used  Substance and Sexual Activity   Alcohol use: No   Drug use: No   Sexual activity: Not on file  Other Topics Concern   Not on file  Social History Narrative   Lives in a dorm at [employer] [facility]. She is a freshman   Right handed    Caffeine: 1 cup coffee/day   Social Determinants of Corporate investment banker Strain: Not on file  Food Insecurity: Not on file  Transportation Needs: Not on file  Physical Activity: Not on file  Stress: Not on file  Social Connections: Not on file  Intimate Partner Violence: Not on file    Family History  Problem Relation Age of Onset   Migraines Mother    High blood pressure Other    High Cholesterol Other    Anxiety disorder Other     Past Medical History:  Diagnosis Date   Anxiety    Depression    Migraine     Patient Active Problem List   Diagnosis Date Noted   Poisoning by anticholinergic drug, accidental or unintentional, initial encounter 06/13/2022   Generalized anxiety disorder 06/13/2022   Nausea and vomiting 06/13/2022   Dry tooth socket 06/12/2022   Chronic migraine without aura without status migrainosus, not intractable 03/10/2022   Migraine with aura and without status migrainosus, not intractable 01/16/2022   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  Current Outpatient Medications  Medication Sig Dispense Refill   Rimegepant Sulfate (NURTEC) 75 MG TBDP Take 1 tablet (75 mg total) by mouth daily as needed. For migraines. Take as close to onset of migraine as possible. One daily maximum. 16 tablet 11   Acetaminophen (TYLENOL PO) Take 2 tablets by mouth every 6 (six) hours as needed (pain).     ALPRAZolam (XANAX) 0.25 MG tablet Take 1 tablet (0.25 mg total) by mouth 3 (three) times daily as needed for anxiety. 14 tablet 0   alum & mag hydroxide-simeth (MAALOX/MYLANTA) 200-200-20 MG/5ML suspension Take 30 mLs by mouth every 4 (four) hours as needed for indigestion or heartburn. 355 mL 0   escitalopram (LEXAPRO) 20 MG tablet Take 20 mg by mouth daily.     Galcanezumab-gnlm (EMGALITY) 120 MG/ML SOAJ Inject 120 mg into the skin every 30 (thirty) days. 1.12 mL 11   ibuprofen (ADVIL) 600 MG tablet Take 600 mg by mouth every 6 (six) hours as needed (pain).     NIKKI 3-0.02 MG tablet Take  1 tablet by mouth daily.     ondansetron (ZOFRAN-ODT) 4 MG disintegrating tablet Take 1 tablet (4 mg total) by mouth every 6 (six) hours as needed. 20 tablet 0   pantoprazole (PROTONIX) 40 MG tablet Take 1 tablet (40 mg total) by mouth daily for 15 days. 15 tablet 0   No current facility-administered medications for this visit.    Allergies as of 02/27/2023 - Review Complete 06/13/2022  Allergen Reaction Noted   Doxycycline Other (See Comments) 12/06/2021   Fructose Other (See Comments) 09/01/2011   Penicillins Rash 05/09/2017    Vitals: There were no vitals taken for this visit. Last Weight:  Wt Readings from Last 1 Encounters:  06/13/22 134 lb 6.4 oz (61 kg) (61 %, Z= 0.27)*   * Growth percentiles are based on CDC (Girls, 2-20 Years) data.   Last Height:   Ht Readings from Last 1 Encounters:  06/13/22 5\' 2"  (1.575 m) (18 %, Z= -0.90)*   * Growth percentiles are based on CDC (Girls, 2-20 Years) data.     Physical exam: Exam: Gen: NAD,  conversant      CV: Could not perform over Web Video. Denies palpitations or chest pain or SOB. VS: Breathing at a normal rate. Weight appears within normal limits. Not febrile. Eyes: Conjunctivae clear without exudates or hemorrhage  Neuro: Detailed Neurologic Exam  Speech:    Speech is normal; fluent and spontaneous with normal comprehension.  Cognition:    The patient is oriented to person, place, and time;     recent and remote memory intact;     language fluent;     normal attention, concentration,     fund of knowledge Cranial Nerves:    The pupils are equal, round, and reactive to light. Cannot perform fundoscopic exam. Visual fields are full to finger confrontation. Extraocular movements are intact.  The face is symmetric with normal sensation. The palate elevates in the midline. Hearing intact. Voice is normal. Shoulder shrug is normal. The tongue has normal motion without fasciculations.   Coordination:    Normal finger to nose  Gait:    Normal native gait  Motor Observation:   no involuntary movements noted. Tone:    Appears normal  Posture:    Posture is normal. normal erect    Strength:    Strength is anti-gravity and symmetric in the upper and lower limbs.      Sensation: intact to LT     Reflex Exam:      Assessment/Plan:  Patient with chronic migraines. Decided on Ajovy today and provided injection training, had to switch to [employer] for insurance. Spent extended visit educating patient on migraines. She has an AVM, has been seen on prior MRIs, MRA in the past recommended but not completed. Discussed imaging and possible migraine treatment. Given worsening headaches and possible vascular malformation need imaging.   Preventative: Monthly Emgality, doing great Acute/emergent: Rizatriptan(Maxalt): Please take one tablet at the onset of your headache. If it does not improve the symptoms please take one additional tablet. Do not take more then 2 tablets in  24hrs. Do not take use more then 2 to 3 times in a week.  Nausea: Ondansetron MRI and MRA of the brain: MRI brain due to concerning symptoms of morning headaches, positional and exertional headaches, AVM(needs follow up imaging), worsening headaches.  Doing great on emgality: She has 5 total migraine days a month and < 10 total headache days a month will prescribe nurtec. Tried Maxalt, imitrex,  Bernita Raisin  02/04/2022: personally reviewd imaging, monitor every 1-2 years  IMPRESSION: This MRI of the brain with and without contrast shows the following: 1.   Small (3 to 4 mm) focus in the right frontal lobe with increased susceptibility on SWI images and mild homogenous enhancement.  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  It is stable compared to the 2015 and 2018 MRIs. 2.   The study was otherwise normal.  No acute findings.    IMPRESSION: This is a normal MR angiogram of the intracranial arteries.  Of note, the region of abnormality noted on the MRI of the brain in the right frontal lobe is normal on the study.   Additional 15 minutes outside chart reviewing imaging.  Meds ordered this encounter  Medications   Rimegepant Sulfate (NURTEC) 75 MG TBDP    Sig: Take 1 tablet (75 mg total) by mouth daily as needed. For migraines. Take as close to onset of migraine as possible. One daily maximum.    Dispense:  16 tablet    Refill:  11    She has 5 total migraine days a month and < 10 total headache days a month will prescribe nurtec. Tried Maxalt, imitrex, Bernita Raisin    CcHessie Diener, NP,  Hessie Diener, NP  Naomie Dean, MD  [facility] [address] [place], [place] [address]  Phone [phone] Fax [phone]  I spent over 60 minutes of face-to-face and non-face-to-face time with patient on the  1. Migraine without aura and without status migrainosus, not intractable     diagnosis.  This  included previsit chart review, lab review, study review, order entry, electronic health record documentation, patient education on the different diagnostic and therapeutic options, counseling and coordination of care, risks and benefits of management, compliance, or risk factor reduction

## 2023-02-28 ENCOUNTER — Telehealth: Payer: Self-pay | Admitting: Neurology

## 2023-02-28 ENCOUNTER — Encounter: Payer: Self-pay | Admitting: Neurology

## 2023-02-28 NOTE — Patient Instructions (Signed)
Continue emgality Start nurtec as needed for migraine  Rimegepant Disintegrating Tablets What is this medication? RIMEGEPANT (ri ME je pant) prevents and treats migraines. It works by blocking a substance in the body that causes migraines. This medicine may be used for other purposes; ask your health care provider or pharmacist if you have questions. COMMON BRAND NAME(S): NURTEC ODT What should I tell my care team before I take this medication? They need to know if you have any of these conditions: Kidney disease Liver disease An unusual or allergic reaction to rimegepant, other medications, foods, dyes, or preservatives Pregnant or trying to get pregnant Breast-feeding How should I use this medication? Take this medication by mouth. Take it as directed on the prescription label. Leave the tablet in the sealed pack until you are ready to take it. With dry hands, open the pack and gently remove the tablet. If the tablet breaks or crumbles, throw it away. Use a new tablet. Place the tablet in the mouth and allow it to dissolve. Then, swallow it. Do not cut, crush, or chew this medication. You do not need water to take this medication. Talk to your care team about the use of this medication in children. Special care may be needed. Overdosage: If you think you have taken too much of this medicine contact a poison control center or emergency room at once. NOTE: This medicine is only for you. Do not share this medicine with others. What if I miss a dose? This does not apply. This medication is not for regular use. What may interact with this medication? Certain medications for fungal infections, such as fluconazole, itraconazole Rifampin This list may not describe all possible interactions. Give your health care provider a list of all the medicines, herbs, non-prescription drugs, or dietary supplements you use. Also tell them if you smoke, drink alcohol, or use illegal drugs. Some items may  interact with your medicine. What should I watch for while using this medication? Visit your care team for regular checks on your progress. Tell your care team if your symptoms do not start to get better or if they get worse. What side effects may I notice from receiving this medication? Side effects that you should report to your care team as soon as possible: Allergic reactions--skin rash, itching, hives, swelling of the face, lips, tongue, or throat Side effects that usually do not require medical attention (report to your care team if they continue or are bothersome): Nausea Stomach pain This list may not describe all possible side effects. Call your doctor for medical advice about side effects. You may report side effects to FDA at 1-800-FDA-1088. Where should I keep my medication? Keep out of the reach of children and pets. Store at room temperature between 20 and 25 degrees C (68 and 77 degrees F). Get rid of any unused medication after the expiration date. To get rid of medications that are no longer needed or have expired: Take the medication to a medication take-back program. Check with your pharmacy or law enforcement to find a location. If you cannot return the medication, check the label or package insert to see if the medication should be thrown out in the garbage or flushed down the toilet. If you are not sure, ask your care team. If it is safe to put it in the trash, take the medication out of the container. Mix the medication with cat litter, dirt, coffee grounds, or other unwanted substance. Seal the mixture in a bag  or container. Put it in the trash. NOTE: This sheet is a summary. It may not cover all possible information. If you have questions about this medicine, talk to your doctor, pharmacist, or health care provider.  2023 Elsevier/Gold Standard (2021-12-08 00:00:00)

## 2023-02-28 NOTE — Telephone Encounter (Signed)
Schedule virtual follow up with her in December with megan please thanks

## 2023-02-28 NOTE — Telephone Encounter (Signed)
Pt scheduled for VV with Megan for 10/10/23 at 2pm

## 2023-03-27 ENCOUNTER — Telehealth: Payer: Self-pay | Admitting: Pharmacy Technician

## 2023-03-27 NOTE — Telephone Encounter (Signed)
Patient Advocate Encounter   Received notification that prior authorization for Nurtec 75MG  dispersible tablets is required.   PA submitted on 03/27/2023 Key [id number] Electronic Prior Authorization Form Status is pending

## 2023-03-29 NOTE — Telephone Encounter (Signed)
Received surescripts notification:  Message Text  NURTEC TAB 75MG  ODT, use as directed (54 per 90 days), is approved through 03/26/2024 or until coverage for the medication is no longer available under your benefit plan or the medication becomes subject to a pharmacy benefit coverage requirement, such as supply limits or notification, whichever occurs first as allowed by law. Please note: Doses/quantities above plan limits and/or maximum Food and Drug Administration (FDA) approved dosing may be subject to further review.  Reviewed by: Lawana Chambers.N.

## 2023-10-10 ENCOUNTER — Telehealth (INDEPENDENT_AMBULATORY_CARE_PROVIDER_SITE_OTHER): Payer: 59 | Admitting: Adult Health

## 2023-10-10 DIAGNOSIS — G43711 Chronic migraine without aura, intractable, with status migrainosus: Secondary | ICD-10-CM

## 2023-10-10 DIAGNOSIS — G43009 Migraine without aura, not intractable, without status migrainosus: Secondary | ICD-10-CM

## 2023-10-10 MED ORDER — EMGALITY 120 MG/ML ~~LOC~~ SOAJ: 120.0000 mg | SUBCUTANEOUS | 11 refills | Status: DC

## 2023-10-10 NOTE — Progress Notes (Signed)
PATIENT: Charlotte Bryant DOB: 2001-04-01  REASON FOR VISIT: follow up HISTORY FROM: patient  Virtual Visit via Video Note  I connected with Charlotte Bryant on 10/10/23 at  2:00 PM EST by a video enabled telemedicine application located remotely at [facility] and verified that I am speaking with the correct person using two identifiers who was located at their own home.   I discussed the limitations of evaluation and management by telemedicine and the availability of in person appointments. The patient expressed understanding and agreed to proceed.   PATIENT: Charlotte Bryant DOB: October 31, 2001  REASON FOR VISIT: follow up HISTORY FROM: patient  HISTORY OF PRESENT ILLNESS: Today 10/10/23:  Charlotte Bryant is a 22 y.o. female with a history of Migraine. Returns today for follow-up.  She remains on Emgality and Nurtec.  She states that this has been working well.  She states that she is having more headaches at the end of the semester.  She feels that is more related to stress.  She does not want to make any adjustments to her medication regimen at this time.    HISTORY 02/27/2023:When I saw her initially she was having bout >10 migraine days a month and daily headaches for > 7 months.The emgality helped tremendously she missed dose in March but now is a stress full and still better by at least 50% was better before the stress. She has 5 migraines a month and < 10 total headache days a month will prescribe nurtec.    She has 5 total migraines a month and < 10 total headache days a month will prescribe nurtec. Tried Maxalt, imitrex, ubrelvy   Patient complains of symptoms per HPI as well as the following symptoms: migraines . Pertinent negatives and positives per HPI. All others negative     HPI:  Charlotte Bryant is a 22 y.o. female here as requested by Hessie Diener, NP for migraine. PMHx migraines, anxiety, ocd(skin picking).    She has had headaches for most  of her life, gotten worse since she went to college [employer]. She had one that was so bad she couldn't see it was so blurry, then half hour later she had the worst headache and then slept. In the temples, throbbing/pulsating, light and sound sensitivity, nausea and dizziness she is dizzy a lot. Very fatigued, can never get enough sleep, she naps a lot. About 10 migraine days a month and daily headaches for > 7 months. Migraines can last up to 24 hours, she sleeps them off and takes ibuprofen/advil. We discussed migraines in detail today, acute and preventative management. I gave her lots of literature to read on migraine associated vertigo, migraine aura, gave her an introductory migraine patient information. Spend extended visit educating patient. 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 Her headaches can be in the morning, they can be positional and exertional.Discussed imaging and possible migraine treatment. Possible migraine aura, discussed risk of stroke with patient. No other focal neurologic deficits, associated symptoms, inciting events or modifiable factors. Mother is here today and provides much information and discussed options and plans with mother as well.     Reviewed notes, labs and imaging from outside physicians, which showed:    TSH 2.16 normal, cmp normal BUN 9 creat 0.75, CBC nml, Vit D(OH) 23   Thorough review of records, medications tried that can be used in migraine management includes: Zofran, Lexapro, Cymbalta,  ibuprofen, amitriptyline, Bp med contraindicated due to hypotension, topiramate contraindicated due to depression, low BMI, mood disorder (topiramate can cause worsening depression and weight loss). Maxalt, imitrex, ubrelvy   MRI and MRA::This study shows a small area of signal change in the anterior temporal lobe on the right suggesting a vascular pattern. However there is no enhancement. The possibility of AV  malformation is to be considered and MR angiogram may be indicated    REVIEW OF SYSTEMS: Out of a complete 14 system review of symptoms, the patient complains only of the following symptoms, and all other reviewed systems are negative.  ALLERGIES: Allergies  Allergen Reactions   Doxycycline Other (See Comments)    Fever, sweats and chills   Fructose Other (See Comments)    Digestive problems   Penicillins Rash    Childhood reaction    HOME MEDICATIONS: Outpatient Medications Prior to Visit  Medication Sig Dispense Refill   Acetaminophen (TYLENOL PO) Take 2 tablets by mouth every 6 (six) hours as needed (pain).     ALPRAZolam (XANAX) 0.25 MG tablet Take 1 tablet (0.25 mg total) by mouth 3 (three) times daily as needed for anxiety. 14 tablet 0   alum & mag hydroxide-simeth (MAALOX/MYLANTA) 200-200-20 MG/5ML suspension Take 30 mLs by mouth every 4 (four) hours as needed for indigestion or heartburn. 355 mL 0   escitalopram (LEXAPRO) 20 MG tablet Take 20 mg by mouth daily.     Galcanezumab-gnlm (EMGALITY) 120 MG/ML SOAJ Inject 120 mg into the skin every 30 (thirty) days. 1.12 mL 11   ibuprofen (ADVIL) 600 MG tablet Take 600 mg by mouth every 6 (six) hours as needed (pain).     NIKKI 3-0.02 MG tablet Take 1 tablet by mouth daily.     ondansetron (ZOFRAN-ODT) 4 MG disintegrating tablet Take 1 tablet (4 mg total) by mouth every 6 (six) hours as needed. 20 tablet 0   pantoprazole (PROTONIX) 40 MG tablet Take 1 tablet (40 mg total) by mouth daily for 15 days. 15 tablet 0   Rimegepant Sulfate (NURTEC) 75 MG TBDP Take 1 tablet (75 mg total) by mouth daily as needed. For migraines. Take as close to onset of migraine as possible. One daily maximum. 16 tablet 11   No facility-administered medications prior to visit.    PAST MEDICAL HISTORY: Past Medical History:  Diagnosis Date   Anxiety    Depression    Migraine     PAST SURGICAL HISTORY: No past surgical history on file.  FAMILY  HISTORY: Family History  Problem Relation Age of Onset   Migraines Mother    High blood pressure Other    High Cholesterol Other    Anxiety disorder Other     SOCIAL HISTORY: Social History   Socioeconomic History   Marital status: Single    Spouse name: Not on file   Number of children: Not on file   Years of education: Not on file   Highest education level: Not on file  Occupational History   Not on file  Tobacco Use   Smoking status: Never   Smokeless tobacco: Never  Vaping Use   Vaping status: Never Used  Substance and Sexual Activity   Alcohol use: No   Drug use: No   Sexual activity: Not on file  Other Topics Concern   Not on file  Social History Narrative   Lives in a dorm at [employer]. She is a freshman   Right handed    Caffeine: 1 cup coffee/day  Social Determinants of Health   Financial Resource Strain: Not on file  Food Insecurity: No Food Insecurity (02/11/2021)   Received from [facility]   Hunger Vital Sign    Worried About Running Out of Food in the Last Year: Never true    Ran Out of Food in the Last Year: Never true  Transportation Needs: Not on file  Physical Activity: Not on file  Stress: Not on file  Social Connections: Unknown (03/14/2022)   Received from [facility], [facility]   Social Network    Social Network: Not on file  Intimate Partner Violence: Unknown (02/03/2022)   Received from [facility], [facility]   HITS    Physically Hurt: Not on file    Insult or Talk Down To: Not on file    Threaten Physical Harm: Not on file    Scream or Curse: Not on file      PHYSICAL EXAM Generalized: Well developed, in no acute distress   Neurological examination  Mentation: Alert oriented to time, place, history taking. Follows all commands speech and language fluent Cranial nerve II-XII:Extraocular movements were full. Facial symmetry noted.   DIAGNOSTIC DATA (LABS, IMAGING, TESTING) - I reviewed patient records,  labs, notes, testing and imaging myself where available.  Lab Results  Component Value Date   WBC 6.1 06/13/2022   HGB 11.9 (L) 06/13/2022   HCT 34.6 (L) 06/13/2022   MCV 88.0 06/13/2022   PLT 264 06/13/2022      Component Value Date/Time   NA 139 06/13/2022 0751   K 3.5 06/13/2022 0751   CL 110 06/13/2022 0751   CO2 21 (L) 06/13/2022 0751   GLUCOSE 79 06/13/2022 0751   BUN 6 06/13/2022 0751   CREATININE 0.72 06/13/2022 0751   CALCIUM 9.1 06/13/2022 0751   PROT 6.9 06/13/2022 0751   ALBUMIN 3.5 06/13/2022 0751   AST 13 (L) 06/13/2022 0751   ALT 12 06/13/2022 0751   ALKPHOS 44 06/13/2022 0751   BILITOT 0.5 06/13/2022 0751   GFRNONAA >60 06/13/2022 0751      ASSESSMENT AND PLAN 22 y.o. year old female  has a past medical history of Anxiety, Depression, and Migraine. here with:  1.  Migraine headache  -Continue Emgality monthly injection for prevention -Continue Nurtec for abortive therapy -Did advise that if her headache frequency or migraines worsen we can consider changes in her medication. -Also caution the patient about pregnancy while on Emgality.  Will need to be off Emgality 6 months before trying to conceive.  Patient advised that she is not actively trying to get pregnant. - FU in 1 year or sooner if needed     Butch Penny, MSN, NP-C 10/10/2023, 1:49 PM [facility] [address], [address] [place], [place] [address] [phone]

## 2023-10-17 LAB — LAB REPORT - SCANNED: EGFR: 112

## 2023-11-01 DIAGNOSIS — K52832 Lymphocytic colitis: Secondary | ICD-10-CM

## 2023-11-01 DIAGNOSIS — K2970 Gastritis, unspecified, without bleeding: Secondary | ICD-10-CM

## 2023-11-01 HISTORY — PX: UPPER GI ENDOSCOPY: SHX6162

## 2023-11-01 HISTORY — DX: Lymphocytic colitis: K52.832

## 2023-11-01 HISTORY — PX: COLONOSCOPY: SHX174

## 2023-11-01 HISTORY — DX: Gastritis, unspecified, without bleeding: K29.70

## 2023-11-02 LAB — HM COLONOSCOPY

## 2024-02-07 ENCOUNTER — Other Ambulatory Visit: Payer: Self-pay | Admitting: Gastroenterology

## 2024-02-07 ENCOUNTER — Ambulatory Visit
Admission: RE | Admit: 2024-02-07 | Discharge: 2024-02-07 | Disposition: A | Discharge status: Home / Self Care | Payer: PRIVATE HEALTH INSURANCE | Source: Ambulatory Visit | Attending: Gastroenterology | Admitting: Gastroenterology

## 2024-02-07 DIAGNOSIS — R110 Nausea: Secondary | ICD-10-CM

## 2024-02-07 DIAGNOSIS — K5900 Constipation, unspecified: Secondary | ICD-10-CM

## 2024-02-27 ENCOUNTER — Other Ambulatory Visit (HOSPITAL_COMMUNITY): Payer: Self-pay

## 2024-02-27 ENCOUNTER — Telehealth: Payer: Self-pay | Admitting: Pharmacist

## 2024-02-27 NOTE — Telephone Encounter (Signed)
 Pharmacy Patient Advocate Encounter   Received notification from CoverMyMeds that prior authorization for Nurtec 75MG  dispersible tablets is due for renewal.   Insurance verification completed.   The patient is insured through [facility].  PA not needed at this time:

## 2024-04-14 ENCOUNTER — Other Ambulatory Visit: Payer: Self-pay

## 2024-04-14 ENCOUNTER — Ambulatory Visit
Admission: RE | Admit: 2024-04-14 | Discharge: 2024-04-14 | Disposition: A | Discharge status: Home / Self Care | Source: Ambulatory Visit

## 2024-04-14 ENCOUNTER — Ambulatory Visit

## 2024-04-14 ENCOUNTER — Ambulatory Visit: Payer: Self-pay

## 2024-04-14 VITALS — BP 98/65 | HR 64 | Temp 98.9°F | Resp 18

## 2024-04-14 DIAGNOSIS — S161XXA Strain of muscle, fascia and tendon at neck level, initial encounter: Secondary | ICD-10-CM | POA: Diagnosis not present

## 2024-04-14 DIAGNOSIS — M542 Cervicalgia: Secondary | ICD-10-CM | POA: Diagnosis not present

## 2024-04-14 MED ORDER — PREDNISONE 20 MG PO TABS: 40.0000 mg | ORAL_TABLET | Freq: Every day | ORAL | 0 refills | Status: AC

## 2024-04-14 MED ORDER — CYCLOBENZAPRINE HCL 5 MG PO TABS: 5.0000 mg | ORAL_TABLET | Freq: Three times a day (TID) | ORAL | 0 refills | Status: AC | PRN
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
## 2024-04-14 NOTE — Discharge Instructions (Addendum)
 X-ray of the neck done today.  Final evaluation by the radiologist shows no acute fracture.  Symptoms are likely secondary to muscular spasm and strain.  We will treat with the following: Flexeril 5 mg every 8 hours as needed for muscle spasms.  Use caution as this medication can cause drowsiness. Prednisone 40 mg (2 tablets) once daily for 5 days. Take this in the morning.  This is a steroid to help with inflammation and pain. Light stretching to improve stiffness and mobility May alternate heat and ice If symptoms persist after a few days or worsen then recommend following up with orthopedics Follow-up at urgent care as needed
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
## 2024-05-09 ENCOUNTER — Ambulatory Visit: Payer: Self-pay | Attending: Rheumatology | Admitting: Rheumatology

## 2024-05-09 ENCOUNTER — Encounter: Payer: Self-pay | Admitting: Rheumatology

## 2024-05-09 VITALS — BP 99/64 | HR 80 | Resp 16 | Ht 63.25 in | Wt 142.0 lb

## 2024-05-09 DIAGNOSIS — M797 Fibromyalgia: Secondary | ICD-10-CM

## 2024-05-09 DIAGNOSIS — F411 Generalized anxiety disorder: Secondary | ICD-10-CM

## 2024-05-09 DIAGNOSIS — K52832 Lymphocytic colitis: Secondary | ICD-10-CM

## 2024-05-09 DIAGNOSIS — M791 Myalgia, unspecified site: Secondary | ICD-10-CM | POA: Diagnosis not present

## 2024-05-09 DIAGNOSIS — R5383 Other fatigue: Secondary | ICD-10-CM

## 2024-05-09 DIAGNOSIS — M2550 Pain in unspecified joint: Secondary | ICD-10-CM | POA: Diagnosis not present

## 2024-05-09 DIAGNOSIS — K200 Eosinophilic esophagitis: Secondary | ICD-10-CM

## 2024-05-09 DIAGNOSIS — E559 Vitamin D deficiency, unspecified: Secondary | ICD-10-CM

## 2024-05-09 DIAGNOSIS — D1802 Hemangioma of intracranial structures: Secondary | ICD-10-CM

## 2024-05-09 DIAGNOSIS — G43709 Chronic migraine without aura, not intractable, without status migrainosus: Secondary | ICD-10-CM

## 2024-05-09 DIAGNOSIS — Z8269 Family history of other diseases of the musculoskeletal system and connective tissue: Secondary | ICD-10-CM

## 2024-05-09 NOTE — Patient Instructions (Signed)
 Myofascial Pain Syndrome and Fibromyalgia Myofascial pain syndrome and fibromyalgia are both pain disorders. You may feel this pain mainly in your muscles. Myofascial pain syndrome: Always has tender points in the muscles that will cause pain when pressed (trigger points). The pain may come and go. Usually affects your neck, upper back, and shoulder areas. The pain often moves into your arms and hands. Fibromyalgia: Has muscle pains and tenderness that come and go. Is often associated with tiredness (fatigue) and sleep problems. Has trigger points. Tends to be long-lasting (chronic), but is not life-threatening. Fibromyalgia and myofascial pain syndrome are not the same. However, they often occur together. If you have both conditions, each can make the other worse. Both are common and can cause enough pain and fatigue to make day-to-day activities difficult. Both can be hard to diagnose because their symptoms are common in many other conditions. What are the causes? The exact causes of these conditions are not known. What increases the risk? You are more likely to develop either of these conditions if: You have a family history of the condition. You are female. You have certain triggers, such as: Spine disorders. An injury (trauma) or other physical stressors. Being under a lot of stress. Medical conditions such as osteoarthritis, rheumatoid arthritis, or lupus. What are the signs or symptoms? Fibromyalgia The main symptom of fibromyalgia is widespread pain and tenderness in your muscles. Pain is sometimes described as stabbing, shooting, or burning. You may also have: Tingling or numbness. Sleep problems and fatigue. Problems with attention and concentration (fibro fog). Other symptoms may include: Bowel and bladder problems. Headaches. Vision problems. Sensitivity to odors and noises. Depression or mood changes. Painful menstrual periods (dysmenorrhea). Dry skin or eyes. These  symptoms can vary over time. Myofascial pain syndrome Symptoms of myofascial pain syndrome include: Tight, ropy bands of muscle. Uncomfortable sensations in muscle areas. These may include aching, cramping, burning, numbness, tingling, and weakness. Difficulty moving certain parts of the body freely (poor range of motion). How is this diagnosed? This condition may be diagnosed by your symptoms and medical history. You will also have a physical exam. In general: Fibromyalgia is diagnosed if you have pain, fatigue, and other symptoms for more than 3 months, and symptoms cannot be explained by another condition. Myofascial pain syndrome is diagnosed if you have trigger points in your muscles, and those trigger points are tender and cause pain elsewhere in your body (referred pain). How is this treated? Treatment for these conditions depends on the type that you have. For fibromyalgia, a healthy lifestyle is the most important treatment including aerobic and strength exercises. Different types of medicines are used to help treat pain and include: NSAIDs. Medicines for treating depression. Medicines that help control seizures. Medicines that relax the muscles. Treatment for myofascial pain syndrome includes: Pain medicines, such as NSAIDs. Cooling and stretching of muscles. Massage therapy with myofascial release technique. Trigger point injections. Treating these conditions often requires a team of health care providers. These may include: Your primary care provider. A physical therapist. Complementary health care providers, such as massage therapists or acupuncturists. A psychiatrist for cognitive behavioral therapy. Follow these instructions at home: Medicines Take over-the-counter and prescription medicines only as told by your health care provider. Ask your health care provider if the medicine prescribed to you: Requires you to avoid driving or using machinery. Can cause constipation.  You may need to take these actions to prevent or treat constipation: Drink enough fluid to keep your urine pale  yellow. Take over-the-counter or prescription medicines. Eat foods that are high in fiber, such as beans, whole grains, and fresh fruits and vegetables. Limit foods that are high in fat and processed sugars, such as fried or sweet foods. Lifestyle  Do exercises as told by your health care provider or physical therapist. Practice relaxation techniques to control your stress. You may want to try: Biofeedback. Visual imagery. Hypnosis. Muscle relaxation. Yoga. Meditation. Maintain a healthy lifestyle. This includes eating a healthy diet and getting enough sleep. Do not use any products that contain nicotine or tobacco. These products include cigarettes, chewing tobacco, and vaping devices, such as e-cigarettes. If you need help quitting, ask your health care provider. General instructions Talk to your health care provider about complementary treatments, such as acupuncture or massage. Do not do activities that stress or strain your muscles. This includes repetitive motions and heavy lifting. Keep all follow-up visits. This is important. Where to find support Consider joining a support group with others who are diagnosed with this condition. National Fibromyalgia Association: fmaware.org Where to find more information [place] Foundation: uspainfoundation.org Contact a health care provider if: You have new symptoms. Your symptoms get worse or your pain is severe. You have side effects from your medicines. You have trouble sleeping. Your condition is causing depression or anxiety. Get help right away if: You have thoughts of hurting yourself or others. Get help right away if you feel like you may hurt yourself or others, or have thoughts about taking your own life. Go to your nearest emergency room or: Call 911. Call the National Suicide Prevention Lifeline at 859-509-8518  or 988. This is open 24 hours a day. Text the Crisis Text Line at 812-711-4646. This information is not intended to replace advice given to you by your health care provider. Make sure you discuss any questions you have with your health care provider. Document Revised: 07/25/2022 Document Reviewed: 09/17/2021 Elsevier Patient Education  2024 [employer]  Duloxetine Delayed-Release Capsules What is this medication? DULOXETINE (doo LOX e teen) treats depression, anxiety, fibromyalgia, and certain types of chronic pain such as nerve, bone, or joint pain. It increases the amount of serotonin and norepinephrine in the brain, hormones that help regulate mood and pain. It belongs to a group of medications called SNRIs. This medicine may be used for other purposes; ask your health care provider or pharmacist if you have questions. COMMON BRAND NAME(S): Cymbalta, Drizalma, Irenka What should I tell my care team before I take this medication? They need to know if you have any of these conditions: Bipolar disorder Glaucoma High blood pressure Kidney disease Liver disease Seizures Suicidal thoughts, plans, or attempt by you or a family member Take medications that treat or prevent blood clots Taken an MAOI, such as Carbex, Eldepryl, Marplan, Nardil, or Parnate in the last 14 days Trouble passing urine An unusual reaction to duloxetine, other medications, foods, dyes, or preservatives Pregnant or trying to get pregnant Breastfeeding How should I use this medication? Take this medication by mouth with water. Take it as directed on the prescription label at the same time every day. Do not cut, crush, or chew this medication. Swallow the capsules whole. Some capsules may be opened and sprinkled on applesauce. Check with your care team or pharmacist if you are not sure. You can take this medication with or without food. Do not take your medication more often than directed. Do not stop taking this medication  suddenly except upon the advice of  your care team. Stopping this medication too quickly may cause serious side effects or your condition may worsen. A special MedGuide will be given to you by the pharmacist with each prescription and refill. Be sure to read this information carefully each time. Talk to your care team about the use of this medication in children. While it may be prescribed for children as young as 7 years for selected conditions, precautions do apply. Overdosage: If you think you have taken too much of this medicine contact a poison control center or emergency room at once. NOTE: This medicine is only for you. Do not share this medicine with others. What if I miss a dose? If you miss a dose, take it as soon as you can. If it is almost time for your next dose, take only that dose. Do not take double or extra doses. What may interact with this medication? Do not take this medication with any of the following: Desvenlafaxine Levomilnacipran Linezolid MAOIs, such as Carbex, Eldepryl, Emsam, Marplan, Nardil, and Parnate Methylene blue (injected into a vein) Milnacipran Safinamide Thioridazine Venlafaxine Viloxazine This medication may also interact with the following: Alcohol Amphetamines Aspirin and aspirin-like medications Certain antibiotics, such as ciprofloxacin and enoxacin Certain medications for blood pressure, heart disease, irregular heart beat Certain medications for mental health conditions Certain medications for migraine headache, such as almotriptan, eletriptan, frovatriptan, naratriptan, rizatriptan , sumatriptan , zolmitriptan Certain medications that treat or prevent blood clots, such as warfarin, enoxaparin , and dalteparin Cimetidine Fentanyl Lithium NSAIDS, medications for pain and inflammation, such as ibuprofen  or naproxen Phentermine Procarbazine Rasagiline Sibutramine St. John's Wort Theophylline Tramadol Tryptophan This list may not describe  all possible interactions. Give your health care provider a list of all the medicines, herbs, non-prescription drugs, or dietary supplements you use. Also tell them if you smoke, drink alcohol, or use illegal drugs. Some items may interact with your medicine. What should I watch for while using this medication? Tell your care team if your symptoms do not get better or if they get worse. Visit your care team for regular checks on your progress. Because it may take several weeks to see the full effects of this medication, it is important to continue your treatment as prescribed by your care team. This medication may cause serious skin reactions. They can happen weeks to months after starting the medication. Contact your care team right away if you notice fevers or flu-like symptoms with a rash. The rash may be red or purple and then turn into blisters or peeling of the skin. You may also notice a red rash with swelling of the face, lips, or lymph nodes in your neck or under your arms. Watch for new or worsening thoughts of suicide or depression. This includes sudden changes in mood, behaviors, or thoughts. These changes can happen at any time but are more common in the beginning of treatment or after a change in dose. Call your care team right away if you experience these thoughts or worsening depression. This medication may cause mood and behavior changes, such as anxiety, nervousness, irritability, hostility, restlessness, excitability, hyperactivity, or trouble sleeping. These changes can happen at any time but are more common in the beginning of treatment or after a change in dose. Call your care team right away if you notice any of these symptoms. This medication may affect your coordination, reaction time, or judgment. Do not drive or operate machinery until you know how this medication affects you. Sit up or stand slowly to reduce  the risk of dizzy or fainting spells. Drinking alcohol with this medication  can increase the risk of these side effects. This medication may increase blood sugar. The risk may be higher in patients who already have diabetes. Ask your care team what you can do to lower your risk of diabetes while taking this medication. This medication can cause an increase in blood pressure. This medication can also cause a sudden drop in your blood pressure, which may make you feel faint and increase the chance of a fall. These effects are most common when you first start the medication or when the dose is increased, or during use of other medications that can cause a sudden drop in blood pressure. Check with your care team for instructions on monitoring your blood pressure while taking this medication. Your mouth may get dry. Chewing sugarless gum or sucking hard candy and drinking plenty of water may help. Contact your care team if the problem does not go away or is severe. What side effects may I notice from receiving this medication? Side effects that you should report to your care team as soon as possible: Allergic reactions--skin rash, itching, hives, swelling of the face, lips, tongue, or throat Bleeding--bloody or black, tar-like stools, red or dark brown urine, vomiting blood or brown material that looks like coffee grounds, small, red or purple spots on skin, unusual bleeding or bruising Increase in blood pressure Liver injury--right upper belly pain, loss of appetite, nausea, light-colored stool, dark yellow or brown urine, yellowing skin or eyes, unusual weakness or fatigue Low sodium level--muscle weakness, fatigue, dizziness, headache, confusion Redness, blistering, peeling, or loosening of the skin, including inside the mouth Serotonin syndrome--irritability, confusion, fast or irregular heartbeat, muscle stiffness, twitching muscles, sweating, high fever, seizures, chills, vomiting, diarrhea Sudden eye pain or change in vision such as blurry vision, seeing halos around lights,  vision loss Thoughts of suicide or self-harm, worsening mood, feelings of depression Trouble passing urine Side effects that usually do not require medical attention (report to your care team if they continue or are bothersome): Change in sex drive or performance Constipation Diarrhea Dizziness Dry mouth Excessive sweating Loss of appetite Nausea Vomiting This list may not describe all possible side effects. Call your doctor for medical advice about side effects. You may report side effects to FDA at 1-800-FDA-1088. Where should I keep my medication? Keep out of the reach of children and pets. Store at room temperature between 15 and 30 degrees C (59 to 86 degrees F). Get rid of any unused medication after the expiration date. To get rid of medications that are no longer needed or have expired: Take the medication to a medication take-back program. Check with your pharmacy or law enforcement to find a location. If you cannot return the medication, check the label or package insert to see if the medication should be thrown out in the garbage or flushed down the toilet. If you are not sure, ask your care team. If it is safe to put it in the trash, take the medication out of the container. Mix the medication with cat litter, dirt, coffee grounds, or other unwanted substance. Seal the mixture in a bag or container. Put it in the trash. NOTE: This sheet is a summary. It may not cover all possible information. If you have questions about this medicine, talk to your doctor, pharmacist, or health care provider.  2024 Elsevier/Gold Standard (2022-07-21 00:00:00)

## 2024-05-13 ENCOUNTER — Ambulatory Visit: Payer: Self-pay | Admitting: Rheumatology

## 2024-05-13 NOTE — Progress Notes (Signed)
 CBC normal, CMP normal, sed rate is mildly elevated, SPEP normal, ANA low titer positive, rheumatoid factor negative, anti-CCP negative, TSH normal, vitamin D  normal, B12 normal, immunoglobulins normal.  Please add ENA if possible.  I will discuss results at the follow-up visit

## 2024-05-14 LAB — COMPREHENSIVE METABOLIC PANEL WITH GFR
AG Ratio: 1.4 (calc) (ref 1.0–2.5)
ALT: 12 U/L (ref 6–29)
AST: 18 U/L (ref 10–30)
Albumin: 4.2 g/dL (ref 3.6–5.1)
Alkaline phosphatase (APISO): 62 U/L (ref 31–125)
BUN: 8 mg/dL (ref 7–25)
CO2: 26 mmol/L (ref 20–32)
Calcium: 9.6 mg/dL (ref 8.6–10.2)
Chloride: 104 mmol/L (ref 98–110)
Creat: 0.83 mg/dL (ref 0.50–0.96)
Globulin: 2.9 g/dL (ref 1.9–3.7)
Glucose, Bld: 60 mg/dL — ABNORMAL LOW (ref 65–99)
Potassium: 4.4 mmol/L (ref 3.5–5.3)
Sodium: 139 mmol/L (ref 135–146)
Total Bilirubin: 0.3 mg/dL (ref 0.2–1.2)
Total Protein: 7.1 g/dL (ref 6.1–8.1)
eGFR: 103 mL/min/1.73m2 (ref >=60)

## 2024-05-14 LAB — TEST AUTHORIZATION

## 2024-05-14 LAB — PROTEIN ELECTROPHORESIS, SERUM, WITH REFLEX
Albumin ELP: 4 g/dL (ref 3.8–4.8)
Alpha 1: 0.4 g/dL — ABNORMAL HIGH (ref 0.2–0.3)
Alpha 2: 0.9 g/dL (ref 0.5–0.9)
Beta 2: 0.4 g/dL (ref 0.2–0.5)
Beta Globulin: 0.5 g/dL (ref 0.4–0.6)
Gamma Globulin: 1.3 g/dL (ref 0.8–1.7)
Total Protein: 7.4 g/dL (ref 6.1–8.1)

## 2024-05-14 LAB — CBC WITH DIFFERENTIAL/PLATELET
Absolute Lymphocytes: 1825 {cells}/uL (ref 850–3900)
Absolute Monocytes: 499 {cells}/uL (ref 200–950)
Basophils Absolute: 31 {cells}/uL (ref 0–200)
Basophils Relative: 0.6 %
Eosinophils Absolute: 88 {cells}/uL (ref 15–500)
Eosinophils Relative: 1.7 %
HCT: 38.5 % (ref 35.0–45.0)
Hemoglobin: 12.6 g/dL (ref 11.7–15.5)
MCH: 30.1 pg (ref 27.0–33.0)
MCHC: 32.7 g/dL (ref 32.0–36.0)
MCV: 91.9 fL (ref 80.0–100.0)
MPV: 10.1 fL (ref 7.5–12.5)
Monocytes Relative: 9.6 %
Neutro Abs: 2756 {cells}/uL (ref 1500–7800)
Neutrophils Relative %: 53 %
Platelets: 272 Thousand/uL (ref 140–400)
RBC: 4.19 Million/uL (ref 3.80–5.10)
RDW: 12.6 % (ref 11.0–15.0)
Total Lymphocyte: 35.1 %
WBC: 5.2 Thousand/uL (ref 3.8–10.8)

## 2024-05-14 LAB — TSH: TSH: 1.92 m[IU]/L

## 2024-05-14 LAB — ANTI-NUCLEAR AB-TITER (ANA TITER): ANA Titer 1: 1:80 {titer} — ABNORMAL HIGH

## 2024-05-14 LAB — CK: Total CK: 52 U/L (ref 20–239)

## 2024-05-14 LAB — ANTI-DNA ANTIBODY, DOUBLE-STRANDED: ds DNA Ab: <1 [IU]/mL

## 2024-05-14 LAB — SJOGREN'S SYNDROME ANTIBODS(SSA + SSB)
SSA (Ro) (ENA) Antibody, IgG: <1 AI
SSB (La) (ENA) Antibody, IgG: <1 AI

## 2024-05-14 LAB — RHEUMATOID FACTOR: Rheumatoid fact SerPl-aCnc: <10 [IU]/mL (ref <14)

## 2024-05-14 LAB — SM AND SM/RNP ANTIBODIES
ENA SM Ab Ser-aCnc: <1 AI
SM/RNP: <1 AI

## 2024-05-14 LAB — IGG, IGA, IGM
IgG (Immunoglobin G), Serum: 1522 mg/dL (ref 600–1640)
IgM, Serum: 179 mg/dL (ref 50–300)
Immunoglobulin A: 111 mg/dL (ref 47–310)

## 2024-05-14 LAB — ANTI-SCLERODERMA ANTIBODY: Scleroderma (Scl-70) (ENA) Antibody, IgG: <1 AI

## 2024-05-14 LAB — CYCLIC CITRUL PEPTIDE ANTIBODY, IGG: Cyclic Citrullin Peptide Ab: <16 U

## 2024-05-14 LAB — VITAMIN B12: Vitamin B-12: 235 pg/mL (ref 200–1100)

## 2024-05-14 LAB — ANA: Anti Nuclear Antibody (ANA): POSITIVE — AB

## 2024-05-14 LAB — VITAMIN D 25 HYDROXY (VIT D DEFICIENCY, FRACTURES): Vit D, 25-Hydroxy: 44 ng/mL (ref 30–100)

## 2024-05-14 LAB — SEDIMENTATION RATE: Sed Rate: 31 mm/h — ABNORMAL HIGH (ref 0–20)

## 2024-05-14 NOTE — Progress Notes (Signed)
 CBC normal, CMP normal, CK normal, TSH normal, vitamin D  normal, B12 low normal, sed rate mildly elevated, SPEP normal, immunoglobulins normal, ANA low titer positive, RF negative, anti-CCP negative, ENA panel pending.  Patient should take over-the-counter vitamin B-12.

## 2024-05-15 NOTE — Progress Notes (Signed)
 ENA panel negative.

## 2024-05-30 ENCOUNTER — Encounter: Payer: 59 | Admitting: Rheumatology

## 2024-06-14 NOTE — Progress Notes (Signed)
 Office Visit Note  Patient: Charlotte Bryant             Date of Birth: May 11, 2001           MRN: [id number]             PCP: Alvis Devere BRAVO, NP Referring: Alvis Devere BRAVO, NP Visit Date: 06/28/2024 Occupation: @GUAROCC @  Subjective:  Pain in joints and muscles  History of Present Illness: Charlotte Bryant is a 23 y.o. female with fatigue, arthralgias and myalgias.  She returns today after initial visit on May 09, 2024.  She states she continues to have generalized fatigue and insomnia.  She also has pain and discomfort in all of her joints and muscles.  There is no history of oral ulcers, nasal ulcers, malar rash, photosensitivity, Raynaud's, lymphadenopathy or inflammatory arthritis.  She gives history of frequent diarrhea, constipation, headaches.  She states she used to have painful menstrual cycle but since she has been on oral contraceptive pills she does not have cycles.    Activities of Daily Living:  Patient reports morning stiffness for several hours.   Patient Denies nocturnal pain.  Difficulty dressing/grooming: Denies Difficulty climbing stairs: Denies Difficulty getting out of chair: Denies Difficulty using hands for taps, buttons, cutlery, and/or writing: Denies  Review of Systems  Constitutional:  Positive for fatigue.  HENT:  Negative for mouth sores and mouth dryness.   Eyes:  Negative for dryness.  Respiratory:  Negative for shortness of breath.   Cardiovascular:  Negative for chest pain and palpitations.  Gastrointestinal:  Positive for constipation and diarrhea. Negative for blood in stool.  Endocrine: Negative for increased urination.  Genitourinary:  Negative for involuntary urination.  Musculoskeletal:  Positive for joint pain, joint pain, myalgias, morning stiffness, muscle tenderness and myalgias. Negative for gait problem, joint swelling and muscle weakness.  Skin:  Negative for color change, rash, hair loss and sensitivity to sunlight.   Allergic/Immunologic: Positive for susceptible to infections.  Neurological:  Positive for dizziness and headaches.  Hematological:  Negative for swollen glands.  Psychiatric/Behavioral:  Positive for sleep disturbance. Negative for depressed mood. The patient is nervous/anxious.     PMFS History:  Patient Active Problem List   Diagnosis Date Noted   Poisoning by anticholinergic drug, accidental or unintentional, initial encounter 06/13/2022   Generalized anxiety disorder 06/13/2022   Nausea and vomiting 06/13/2022   Dry tooth socket 06/12/2022   Chronic migraine without aura without status migrainosus, not intractable 03/10/2022   Migraine with aura and without status migrainosus, not intractable 01/16/2022   Venous angioma of brain (HCC) 12/28/2016   Eosinophilic esophagitis 05/26/2014    Past Medical History:  Diagnosis Date   Anxiety    Depression    Gastritis 11/2023   Lymphocytic colitis 11/2023   Migraine     Family History  Problem Relation Age of Onset   Migraines Mother    Bell's palsy Mother    Other Mother        trigeminal neuralgia   High Cholesterol Father    Healthy Sister    Healthy Sister    Healthy Sister    High blood pressure Other    High Cholesterol Other    Anxiety disorder Other    Past Surgical History:  Procedure Laterality Date   COLONOSCOPY  11/2023   UPPER GI ENDOSCOPY  11/2023   WISDOM TOOTH EXTRACTION  05/2022   Social History   Social History Narrative   Lives in a dorm at  [employer]. She is a freshman   Right handed    Caffeine: 1 cup coffee/day    There is no immunization history on file for this patient.   Objective: Vital Signs: BP 113/73 (BP Location: Left Arm, Patient Position: Sitting, Cuff Size: Normal)   Pulse 78   Resp 16   Ht 5' 2 (1.575 m)   Wt 142 lb 3.2 oz (64.5 kg)   BMI 26.01 kg/m    Physical Exam Vitals and nursing note reviewed.  Constitutional:      Appearance: She is well-developed.  HENT:     Head:  Normocephalic and atraumatic.  Eyes:     Conjunctiva/sclera: Conjunctivae normal.  Cardiovascular:     Rate and Rhythm: Normal rate and regular rhythm.     Heart sounds: Normal heart sounds.  Pulmonary:     Effort: Pulmonary effort is normal.     Breath sounds: Normal breath sounds.  Abdominal:     General: Bowel sounds are normal.     Palpations: Abdomen is soft.  Musculoskeletal:     Cervical back: Normal range of motion.  Lymphadenopathy:     Cervical: No cervical adenopathy.  Skin:    General: Skin is warm and dry.     Capillary Refill: Capillary refill takes less than 2 seconds.  Neurological:     Mental Status: She is alert and oriented to person, place, and time.  Psychiatric:        Behavior: Behavior normal.      Musculoskeletal Exam: Cervical, thoracic and lumbar spine Juengel range of motion.  She had bilateral trapezius spasm and tenderness.  She had tenderness over costochondral junction over gluteal region and over bilateral trochanteric bursa.  Shoulders, elbows, wrist joints, MCPs PIPs and DIPs in good range of motion with no synovitis.  Hip joints and knee joints in good range of motion without any warmth swelling or effusion.  There was no tenderness over her ankles or MTPs.  She has generalized hyperalgesia and positive tender points.  CDAI Exam: CDAI Score: -- Patient Global: --; Provider Global: -- Swollen: --; Tender: -- Joint Exam 06/28/2024   No joint exam has been documented for this visit   There is currently no information documented on the homunculus. Go to the Rheumatology activity and complete the homunculus joint exam.  Investigation: No additional findings.  Imaging: No results found.  Recent Labs: Lab Results  Component Value Date   WBC 5.2 05/09/2024   HGB 12.6 05/09/2024   PLT 272 05/09/2024   NA 139 05/09/2024   K 4.4 05/09/2024   CL 104 05/09/2024   CO2 26 05/09/2024   GLUCOSE 60 (L) 05/09/2024   BUN 8 05/09/2024    CREATININE 0.83 05/09/2024   BILITOT 0.3 05/09/2024   ALKPHOS 44 06/13/2022   AST 18 05/09/2024   ALT 12 05/09/2024   PROT 7.1 05/09/2024   PROT 7.4 05/09/2024   ALBUMIN 3.5 06/13/2022   CALCIUM 9.6 05/09/2024   May 09, 2024 SPEP normal, immunoglobulins normal, B12 normal, vitamin D  44, TSH normal, CK 52,  ANA 1: 80 NS,sed rate 31, ENA (dsDNA, SCL 70, Smith, RNP, SSA, SSB) negative, RF negative, anti-CCP negative  Speciality Comments: No specialty comments available.  Procedures:  No procedures performed Allergies: Doxycycline, Fructose, and Penicillins   Assessment / Plan:     Visit Diagnoses: Other fatigue - Chronic fatigue since childhood.  She has been experiencing fatigue since she was a child.  She had positive tender points,  arthralgias and myalgias.  CBC and CMP were normal.  TSH was normal.  Polyarthralgia - History of pain in multiple joints.  She denies any history of joint swelling.  No synovitis was noted on the examination.  She had good mobility in all of her joints.  Myalgia -she complains of pain and discomfort in all of her muscles.  She had positive tender points.  She had hyperalgesia.  CK was normal.  Fibromyalgia - Generalized pain, hyperalgesia and positive tender points noted.  Detail counseled regarding fibromyalgia syndrome was provided.  Benefits of swimming, water aerobics, stretching and yoga were discussed.  She is currently on low-dose Lexapro .  I discussed switching to low-dose Cymbalta which she can discuss further with her PCP.  I will also refer her to physical therapy.  Positive ANA (antinuclear antibody)-ANA is low titer positive and most likely not significant.  Her sed rate was mildly elevated as well.  ENA panel was negative.  Labs were discussed with the patient at length.  She was advised to contact us  if she develops any new symptoms.  I we will recheck labs in 1 year.  Lymphocytic colitis -she is on budesonide 3 mg p.o. daily.  Patient states  it has helped her symptoms.  Eosinophilic esophagitis-currently not symptomatic.  Generalized anxiety disorder-she is on low-dose Lexapro  5 mg p.o. daily.  She may benefit from switching to Cymbalta 20 mg p.o. daily.  She will discuss it further with her PCP.  Chronic migraine without aura without status migrainosus, not intractable  Venous angioma of brain (HCC)  Vitamin D  deficiency-vitamin D  was normal at 44.  Family history of fibromyalgia-mother  Orders: Orders Placed This Encounter  Procedures   Protein / creatinine ratio, urine   CBC with Differential/Platelet   Comprehensive metabolic panel with GFR   Anti-DNA antibody, double-stranded   C3 and C4   Sedimentation rate   ANA   Ambulatory referral to Physical Therapy   No orders of the defined types were placed in this encounter.    Follow-Up Instructions: Return in about 1 year (around 06/28/2025) for FMS.   Maya Nash, MD  Note - This record has been created using Animal nutritionist.  Chart creation errors have been sought, but may not always  have been located. Such creation errors do not reflect on  the standard of medical care.

## 2024-06-28 ENCOUNTER — Ambulatory Visit: Payer: 59 | Attending: Rheumatology | Admitting: Rheumatology

## 2024-06-28 ENCOUNTER — Encounter: Payer: Self-pay | Admitting: Rheumatology

## 2024-06-28 VITALS — BP 113/73 | HR 78 | Resp 16 | Ht 62.0 in | Wt 142.2 lb

## 2024-06-28 DIAGNOSIS — M791 Myalgia, unspecified site: Secondary | ICD-10-CM | POA: Diagnosis not present

## 2024-06-28 DIAGNOSIS — M2550 Pain in unspecified joint: Secondary | ICD-10-CM

## 2024-06-28 DIAGNOSIS — R5383 Other fatigue: Secondary | ICD-10-CM

## 2024-06-28 DIAGNOSIS — K52832 Lymphocytic colitis: Secondary | ICD-10-CM

## 2024-06-28 DIAGNOSIS — D1802 Hemangioma of intracranial structures: Secondary | ICD-10-CM

## 2024-06-28 DIAGNOSIS — R768 Other specified abnormal immunological findings in serum: Secondary | ICD-10-CM | POA: Diagnosis not present

## 2024-06-28 DIAGNOSIS — M797 Fibromyalgia: Secondary | ICD-10-CM

## 2024-06-28 DIAGNOSIS — G43709 Chronic migraine without aura, not intractable, without status migrainosus: Secondary | ICD-10-CM

## 2024-06-28 DIAGNOSIS — K200 Eosinophilic esophagitis: Secondary | ICD-10-CM

## 2024-06-28 DIAGNOSIS — E559 Vitamin D deficiency, unspecified: Secondary | ICD-10-CM

## 2024-06-28 DIAGNOSIS — F411 Generalized anxiety disorder: Secondary | ICD-10-CM

## 2024-06-28 DIAGNOSIS — Z8269 Family history of other diseases of the musculoskeletal system and connective tissue: Secondary | ICD-10-CM

## 2024-06-28 NOTE — Patient Instructions (Signed)
 Standing Labs We placed an order today for your standing lab work.   Please have your standing labs drawn in 1 year  Please have your labs drawn 2 weeks prior to your appointment so that the provider can discuss your lab results at your appointment, if possible.  Please note that you may see your imaging and lab results in MyChart before we have reviewed them. We will contact you once all results are reviewed. Please allow our office up to 72 hours to thoroughly review all of the results before contacting the office for clarification of your results.  WALK-IN LAB HOURS  Monday through Thursday from 8:00 am -12:30 pm and 1:00 pm-4:30 pm and Friday from 8:00 am-12:00 pm.  Patients with office visits requiring labs will be seen before walk-in labs.  You may encounter longer than normal wait times. Please allow additional time. Wait times may be shorter on  Monday and Thursday afternoons.  We do not book appointments for walk-in labs. We appreciate your patience and understanding with our staff.   Labs are drawn by [facility]. Please bring your co-pay at the time of your lab draw.  You may receive a bill from [facility] for your lab work.  Please note if you are on Hydroxychloroquine and and an order has been placed for a Hydroxychloroquine level,  you will need to have it drawn 4 hours or more after your last dose.  If you wish to have your labs drawn at another location, please call the office 24 hours in advance so we can fax the orders.  The office is located at [address], [address], [place], [place] [address]   If you have any questions regarding directions or hours of operation,  please call [phone].   As a reminder, please drink plenty of water prior to coming for your lab work. Thanks!

## 2024-07-26 ENCOUNTER — Encounter: Payer: Self-pay | Admitting: Student

## 2024-07-29 ENCOUNTER — Other Ambulatory Visit: Payer: Self-pay | Admitting: Student

## 2024-07-29 DIAGNOSIS — R109 Unspecified abdominal pain: Secondary | ICD-10-CM

## 2024-07-29 DIAGNOSIS — R197 Diarrhea, unspecified: Secondary | ICD-10-CM

## 2024-07-29 DIAGNOSIS — K5900 Constipation, unspecified: Secondary | ICD-10-CM

## 2024-07-29 DIAGNOSIS — D649 Anemia, unspecified: Secondary | ICD-10-CM

## 2024-07-30 ENCOUNTER — Ambulatory Visit
Admission: RE | Admit: 2024-07-30 | Discharge: 2024-07-30 | Disposition: A | Discharge status: Home / Self Care | Source: Ambulatory Visit | Attending: Student | Admitting: Student

## 2024-07-30 DIAGNOSIS — R109 Unspecified abdominal pain: Secondary | ICD-10-CM

## 2024-07-30 DIAGNOSIS — D649 Anemia, unspecified: Secondary | ICD-10-CM

## 2024-07-30 DIAGNOSIS — R197 Diarrhea, unspecified: Secondary | ICD-10-CM

## 2024-07-30 DIAGNOSIS — K5900 Constipation, unspecified: Secondary | ICD-10-CM

## 2024-07-30 MED ORDER — IOPAMIDOL (ISOVUE-300) INJECTION 61%
100.0000 mL | Freq: Once | INTRAVENOUS | Status: AC | PRN
  Administered 2024-07-30: 100 mL via INTRAVENOUS

## 2024-09-01 ENCOUNTER — Encounter: Payer: Self-pay | Admitting: Rheumatology

## 2024-09-02 NOTE — Telephone Encounter (Signed)
 She will be followed by her PCP for fibromyalgia.  She should get the form filled by her PCP.

## 2024-09-23 ENCOUNTER — Telehealth: Payer: Self-pay | Admitting: Adult Health

## 2024-09-23 NOTE — Telephone Encounter (Signed)
 LVM and sent mychart msg informing pt of need to reschedule 10/15/24 appt - NP out

## 2024-10-15 ENCOUNTER — Telehealth: Payer: 59 | Admitting: Adult Health

## 2025-05-30 ENCOUNTER — Ambulatory Visit: Admitting: Rheumatology
